# Patient Record
Sex: Female | Born: 1995
Health system: Southern US, Community
[De-identification: ages and names within clinical notes are randomized; demographics above are authoritative.]

## PROBLEM LIST (undated history)

## (undated) DIAGNOSIS — Z789 Other specified health status: Secondary | ICD-10-CM

## (undated) DIAGNOSIS — O24419 Gestational diabetes mellitus in pregnancy, unspecified control: Secondary | ICD-10-CM

## (undated) DIAGNOSIS — E119 Type 2 diabetes mellitus without complications: Secondary | ICD-10-CM

## (undated) HISTORY — PX: NO PAST SURGERIES: SHX2092

## (undated) HISTORY — PX: APPENDECTOMY: SHX54

## (undated) HISTORY — DX: Gestational diabetes mellitus in pregnancy, unspecified control: O24.419

## (undated) HISTORY — DX: Type 2 diabetes mellitus without complications: E11.9

## (undated) HISTORY — DX: Other specified health status: Z78.9

---

## 2018-12-06 ENCOUNTER — Encounter (HOSPITAL_COMMUNITY): Payer: Self-pay

## 2018-12-06 ENCOUNTER — Other Ambulatory Visit: Payer: Self-pay

## 2018-12-06 ENCOUNTER — Inpatient Hospital Stay (HOSPITAL_COMMUNITY)
Admission: AD | Admit: 2018-12-06 | Discharge: 2018-12-06 | Disposition: A | Discharge status: Home / Self Care | Payer: Medicaid Other | Attending: Obstetrics and Gynecology | Admitting: Obstetrics and Gynecology

## 2018-12-06 ENCOUNTER — Inpatient Hospital Stay (HOSPITAL_COMMUNITY): Payer: Medicaid Other

## 2018-12-06 DIAGNOSIS — N939 Abnormal uterine and vaginal bleeding, unspecified: Secondary | ICD-10-CM

## 2018-12-06 DIAGNOSIS — O209 Hemorrhage in early pregnancy, unspecified: Secondary | ICD-10-CM | POA: Insufficient documentation

## 2018-12-06 DIAGNOSIS — Z3A08 8 weeks gestation of pregnancy: Secondary | ICD-10-CM | POA: Insufficient documentation

## 2018-12-06 DIAGNOSIS — Z3A10 10 weeks gestation of pregnancy: Secondary | ICD-10-CM

## 2018-12-06 LAB — URINALYSIS, ROUTINE W REFLEX MICROSCOPIC
Bilirubin Urine: NEGATIVE
Glucose, UA: 50 mg/dL — AB
Hgb urine dipstick: NEGATIVE
Ketones, ur: NEGATIVE mg/dL
Leukocytes,Ua: NEGATIVE
Nitrite: NEGATIVE
Protein, ur: NEGATIVE mg/dL
Specific Gravity, Urine: 1.006 (ref 1.005–1.030)
pH: 7 (ref 5.0–8.0)

## 2018-12-06 LAB — COMPREHENSIVE METABOLIC PANEL
ALT: 14 U/L (ref 0–44)
AST: 17 U/L (ref 15–41)
Albumin: 3.9 g/dL (ref 3.5–5.0)
Alkaline Phosphatase: 43 U/L (ref 38–126)
Anion gap: 9 (ref 5–15)
BUN: <5 mg/dL — ABNORMAL LOW (ref 6–20)
CO2: 22 mmol/L (ref 22–32)
Calcium: 9.1 mg/dL (ref 8.9–10.3)
Chloride: 103 mmol/L (ref 98–111)
Creatinine, Ser: 0.41 mg/dL — ABNORMAL LOW (ref 0.44–1.00)
GFR calc Af Amer: >60 mL/min (ref >60)
GFR calc non Af Amer: >60 mL/min (ref >60)
Glucose, Bld: 89 mg/dL (ref 70–99)
Potassium: 3.3 mmol/L — ABNORMAL LOW (ref 3.5–5.1)
Sodium: 134 mmol/L — ABNORMAL LOW (ref 135–145)
Total Bilirubin: 0.2 mg/dL — ABNORMAL LOW (ref 0.3–1.2)
Total Protein: 7.4 g/dL (ref 6.5–8.1)

## 2018-12-06 LAB — CBC
HCT: 33.7 % — ABNORMAL LOW (ref 36.0–46.0)
Hemoglobin: 11.1 g/dL — ABNORMAL LOW (ref 12.0–15.0)
MCH: 25.6 pg — ABNORMAL LOW (ref 26.0–34.0)
MCHC: 32.9 g/dL (ref 30.0–36.0)
MCV: 77.8 fL — ABNORMAL LOW (ref 80.0–100.0)
Platelets: 192 10*3/uL (ref 150–400)
RBC: 4.33 MIL/uL (ref 3.87–5.11)
RDW: 16.2 % — ABNORMAL HIGH (ref 11.5–15.5)
WBC: 10 10*3/uL (ref 4.0–10.5)
nRBC: 0 % (ref 0.0–0.2)

## 2018-12-06 LAB — WET PREP, GENITAL
Sperm: NONE SEEN
Trich, Wet Prep: NONE SEEN
Yeast Wet Prep HPF POC: NONE SEEN

## 2018-12-06 LAB — ABO/RH: ABO/RH(D): O POS

## 2018-12-06 LAB — HCG, QUANTITATIVE, PREGNANCY: hCG, Beta Chain, Quant, S: 200996 m[IU]/mL — ABNORMAL HIGH (ref <5)

## 2018-12-06 LAB — POCT PREGNANCY, URINE: Preg Test, Ur: POSITIVE — AB

## 2018-12-06 NOTE — Discharge Instructions (Signed)
First Trimester of Pregnancy  The first trimester of pregnancy is from week 1 until the end of week 13 (months 1 through 3). During this time, your baby will begin to develop inside you. At 6-8 weeks, the eyes and face are formed, and the heartbeat can be seen on ultrasound. At the end of 12 weeks, all the baby's organs are formed. Prenatal care is all the medical care you receive before the birth of your baby. Make sure you get good prenatal care and follow all of your doctor's instructions. Follow these instructions at home: Medicines  Take over-the-counter and prescription medicines only as told by your doctor. Some medicines are safe and some medicines are not safe during pregnancy.  Take a prenatal vitamin that contains at least 600 micrograms (mcg) of folic acid.  If you have trouble pooping (constipation), take medicine that will make your stool soft (stool softener) if your doctor approves. Eating and drinking   Eat regular, healthy meals.  Your doctor will tell you the amount of weight gain that is right for you.  Avoid raw meat and uncooked cheese.  If you feel sick to your stomach (nauseous) or throw up (vomit): ? Eat 4 or 5 small meals a day instead of 3 large meals. ? Try eating a few soda crackers. ? Drink liquids between meals instead of during meals.  To prevent constipation: ? Eat foods that are high in fiber, like fresh fruits and vegetables, whole grains, and beans. ? Drink enough fluids to keep your pee (urine) clear or pale yellow. Activity  Exercise only as told by your doctor. Stop exercising if you have cramps or pain in your lower belly (abdomen) or low back.  Do not exercise if it is too hot, too humid, or if you are in a place of great height (high altitude).  Try to avoid standing for long periods of time. Move your legs often if you must stand in one place for a long time.  Avoid heavy lifting.  Wear low-heeled shoes. Sit and stand up straight.   You can have sex unless your doctor tells you not to. Relieving pain and discomfort  Wear a good support bra if your breasts are sore.  Take warm water baths (sitz baths) to soothe pain or discomfort caused by hemorrhoids. Use hemorrhoid cream if your doctor says it is okay.  Rest with your legs raised if you have leg cramps or low back pain.  If you have puffy, bulging veins (varicose veins) in your legs: ? Wear support hose or compression stockings as told by your doctor. ? Raise (elevate) your feet for 15 minutes, 3-4 times a day. ? Limit salt in your food. Prenatal care  Schedule your prenatal visits by the twelfth week of pregnancy.  Write down your questions. Take them to your prenatal visits.  Keep all your prenatal visits as told by your doctor. This is important. Safety  Wear your seat belt at all times when driving.  Make a list of emergency phone numbers. The list should include numbers for family, friends, the hospital, and police and fire departments. General instructions  Ask your doctor for a referral to a local prenatal class. Begin classes no later than at the start of month 6 of your pregnancy.  Ask for help if you need counseling or if you need help with nutrition. Your doctor can give you advice or tell you where to go for help.  Do not use hot tubs, steam   rooms, or saunas.  Do not douche or use tampons or scented sanitary pads.  Do not cross your legs for long periods of time.  Avoid all herbs and alcohol. Avoid drugs that are not approved by your doctor.  Do not use any tobacco products, including cigarettes, chewing tobacco, and electronic cigarettes. If you need help quitting, ask your doctor. You may get counseling or other support to help you quit.  Avoid cat litter boxes and soil used by cats. These carry germs that can cause birth defects in the baby and can cause a loss of your baby (miscarriage) or stillbirth.  Visit your dentist. At home,  brush your teeth with a soft toothbrush. Be gentle when you floss. Contact a doctor if:  You are dizzy.  You have mild cramps or pressure in your lower belly.  You have a nagging pain in your belly area.  You continue to feel sick to your stomach, you throw up, or you have watery poop (diarrhea).  You have a bad smelling fluid coming from your vagina.  You have pain when you pee (urinate).  You have increased puffiness (swelling) in your face, hands, legs, or ankles. Get help right away if:  You have a fever.  You are leaking fluid from your vagina.  You have spotting or bleeding from your vagina.  You have very bad belly cramping or pain.  You gain or lose weight rapidly.  You throw up blood. It may look like coffee grounds.  You are around people who have German measles, fifth disease, or chickenpox.  You have a very bad headache.  You have shortness of breath.  You have any kind of trauma, such as from a fall or a car accident. Summary  The first trimester of pregnancy is from week 1 until the end of week 13 (months 1 through 3).  To take care of yourself and your unborn baby, you will need to eat healthy meals, take medicines only if your doctor tells you to do so, and do activities that are safe for you and your baby.  Keep all follow-up visits as told by your doctor. This is important as your doctor will have to ensure that your baby is healthy and growing well. This information is not intended to replace advice given to you by your health care provider. Make sure you discuss any questions you have with your health care provider. Document Released: 11/28/2007 Document Revised: 06/19/2016 Document Reviewed: 06/19/2016 Elsevier Interactive Patient Education  2019 [employer]  

## 2018-12-06 NOTE — MAU Note (Signed)
Pt states she had some vaginal bleeding yesterday that was bright red she saw it when she wiped but did not need a pad.  She is no longer bleeding since then and is not having any pain.  Her last period was 09/27/2018.  [facility] and has an appointment later this month.

## 2018-12-06 NOTE — MAU Provider Note (Signed)
Patient Regina Hayes is a 23 y.o. G1P0 At [id number] by certain LMP here with complaints of vaginal bleeding. She denies abdominal pain, abnormal discharge, dysuria, NV, HA, body chills, fever.   SHe had a positive pregnancy test at [facility] on 5-27; she is dated by her certain LMP.  History     CSN: [id number]  Arrival date and time: 12/06/18 1542   None     Chief Complaint  Patient presents with  . Vaginal Bleeding   Vaginal Bleeding The patient's primary symptoms include vaginal bleeding. The patient's pertinent negatives include no genital itching. This is a new problem. The current episode started yesterday. The problem occurs constantly. The problem has been resolved. The patient is experiencing no pain. Pertinent negatives include no constipation, diarrhea, discolored urine, dysuria, fever, flank pain, nausea, painful intercourse, urgency or vomiting. The vaginal discharge was bloody. The vaginal bleeding is spotting. She has not been passing clots. She has not been passing tissue. Nothing aggravates the symptoms. She has tried nothing for the symptoms. She is not sexually active.   The patient states that she had two episodes of small spotting yesterday; she did not have to wear a pad. She did not have any pain; she denies any other abnormal discharge or pelvic pain.  OB History    Gravida  1   Para      Term      Preterm      AB      Living        SAB      TAB      Ectopic      Multiple      Live Births              History reviewed. No pertinent past medical history.  History reviewed. No pertinent surgical history.  History reviewed. No pertinent family history.  Social History   Tobacco Use  . Smoking status: Never Smoker  . Smokeless tobacco: Never Used  Substance Use Topics  . Alcohol use: Not Currently    Frequency: Never  . Drug use: Never    Allergies: No Known Allergies  No medications prior to admission.    Review of Systems   Constitutional: Negative for fever.  Respiratory: Negative.   Cardiovascular: Negative.   Gastrointestinal: Negative for constipation, diarrhea, nausea and vomiting.  Genitourinary: Positive for vaginal bleeding. Negative for dysuria, flank pain and urgency.  Musculoskeletal: Negative.   Neurological: Negative.   Psychiatric/Behavioral: Negative.    Physical Exam   Blood pressure 116/78, pulse 69, temperature 98.8 F (37.1 C), temperature source Oral, resp. rate 16, last menstrual period 09/27/2018, SpO2 100 %.  Physical Exam  Constitutional: She is oriented to person, place, and time. She appears well-developed and well-nourished.  HENT:  Head: Normocephalic.  Neck: Normal range of motion.  Respiratory: Effort normal and breath sounds normal.  GI: Soft.  Genitourinary:    Vagina normal.     Genitourinary Comments: NEFG; no blood in the vagina, Cervix is pink with no lesions, no lesions on vaginal walls. Thin amount of white milky discharge in the vagina. No CMT, suprapubic or adnexal tenderness.    Musculoskeletal: Normal range of motion.  Neurological: She is alert and oriented to person, place, and time.  Skin: Skin is warm and dry.  Psychiatric: She has a normal mood and affect.    MAU Course  Procedures  MDM -US confirms SIUP at 8 weeks 5 days; no SCH.  -ABO: O  positive -CBC stable at 33/11.  -clue cells on wet prep, however, patient is asymptomatic. Given that Flagyl is Cat C in pregnancy, will defer treatment.  Assessment and Plan   1. Vaginal bleeding in pregnancy, first trimester   2. Vagina bleeding    2. Patient stable for discharge; plan to keep [facility]B at [facility].   3. Reviewed warning signs, including bleeding and pain, and when to return to MAU. All questions answered.   Mervyn Skeeters Marcellius Montagna 12/06/2018, 7:33 PM

## 2018-12-08 LAB — GC/CHLAMYDIA PROBE AMP (~~LOC~~) NOT AT ARMC
Chlamydia: NEGATIVE
Neisseria Gonorrhea: NEGATIVE

## 2018-12-22 LAB — OB RESULTS CONSOLE ANTIBODY SCREEN: Antibody Screen: NEGATIVE

## 2018-12-22 LAB — CYSTIC FIBROSIS DIAGNOSTIC STUDY: Interpretation-CFDNA:: NEGATIVE

## 2018-12-22 LAB — OB RESULTS CONSOLE GC/CHLAMYDIA
Chlamydia: NEGATIVE
Gonorrhea: NEGATIVE

## 2018-12-22 LAB — OB RESULTS CONSOLE ABO/RH: RH Type: POSITIVE

## 2018-12-22 LAB — OB RESULTS CONSOLE RPR: RPR: NONREACTIVE

## 2018-12-22 LAB — OB RESULTS CONSOLE VARICELLA ZOSTER ANTIBODY, IGG: Varicella: IMMUNE

## 2018-12-22 LAB — CULTURE, OB URINE: Urine Culture, OB: NO GROWTH

## 2018-12-22 LAB — CYTOLOGY - PAP: Pap: NEGATIVE

## 2018-12-23 ENCOUNTER — Other Ambulatory Visit (HOSPITAL_COMMUNITY): Payer: Self-pay | Admitting: Family

## 2018-12-23 DIAGNOSIS — Z3682 Encounter for antenatal screening for nuchal translucency: Secondary | ICD-10-CM

## 2018-12-23 DIAGNOSIS — Z3A13 13 weeks gestation of pregnancy: Secondary | ICD-10-CM

## 2019-01-05 ENCOUNTER — Ambulatory Visit (HOSPITAL_COMMUNITY)
Admission: RE | Admit: 2019-01-05 | Discharge: 2019-01-05 | Disposition: A | Discharge status: Home / Self Care | Payer: Medicaid Other | Source: Ambulatory Visit | Attending: Obstetrics and Gynecology | Admitting: Obstetrics and Gynecology

## 2019-01-05 ENCOUNTER — Ambulatory Visit (HOSPITAL_COMMUNITY): Payer: Medicaid Other

## 2019-01-05 ENCOUNTER — Other Ambulatory Visit: Payer: Self-pay

## 2019-01-05 ENCOUNTER — Encounter (HOSPITAL_COMMUNITY): Payer: Self-pay | Admitting: *Deleted

## 2019-01-05 ENCOUNTER — Ambulatory Visit (HOSPITAL_COMMUNITY): Payer: Medicaid Other | Admitting: *Deleted

## 2019-01-05 VITALS — BP 113/74 | HR 76 | Temp 98.5°F | Ht 62.5 in | Wt 105.2 lb

## 2019-01-05 DIAGNOSIS — Z3A13 13 weeks gestation of pregnancy: Secondary | ICD-10-CM | POA: Insufficient documentation

## 2019-01-05 DIAGNOSIS — Z36 Encounter for antenatal screening for chromosomal anomalies: Secondary | ICD-10-CM

## 2019-01-05 DIAGNOSIS — Z3682 Encounter for antenatal screening for nuchal translucency: Secondary | ICD-10-CM

## 2019-01-05 DIAGNOSIS — Z369 Encounter for antenatal screening, unspecified: Secondary | ICD-10-CM

## 2019-01-07 LAB — FIRST TRIMESTER SCREEN W/NT
CRL: 74.6 mm
DIA MoM: 0.87
DIA Value: 232.4 pg/mL
Gest Age-Collect: 13.3 weeks
Maternal Age At EDD: 25.3 yr
Nuchal Translucency MoM: 1.16
Nuchal Translucency: 1.9 mm
Number of Fetuses: 1
PAPP-A MoM: 0.79
PAPP-A Value: 1622.9 ng/mL
Test Results:: NEGATIVE
Weight: 105 [lb_av]
hCG MoM: 0.8
hCG Value: 82 IU/mL

## 2019-01-28 ENCOUNTER — Other Ambulatory Visit: Payer: Self-pay

## 2019-01-28 DIAGNOSIS — Z20822 Contact with and (suspected) exposure to covid-19: Secondary | ICD-10-CM

## 2019-01-29 LAB — NOVEL CORONAVIRUS, NAA: SARS-CoV-2, NAA: NOT DETECTED

## 2019-04-20 ENCOUNTER — Other Ambulatory Visit: Payer: Self-pay | Admitting: *Deleted

## 2019-04-20 DIAGNOSIS — Z20822 Contact with and (suspected) exposure to covid-19: Secondary | ICD-10-CM

## 2019-04-21 LAB — NOVEL CORONAVIRUS, NAA: SARS-CoV-2, NAA: NOT DETECTED

## 2019-04-23 LAB — GLUCOSE TOLERANCE, 1 HOUR: Glucose 1 Hour: 230

## 2019-04-29 ENCOUNTER — Encounter: Payer: Self-pay | Admitting: *Deleted

## 2019-05-04 ENCOUNTER — Encounter: Payer: Medicaid Other | Admitting: Family Medicine

## 2019-05-07 ENCOUNTER — Encounter: Payer: Self-pay | Admitting: Obstetrics & Gynecology

## 2019-05-07 ENCOUNTER — Other Ambulatory Visit: Payer: Self-pay

## 2019-05-07 ENCOUNTER — Ambulatory Visit (INDEPENDENT_AMBULATORY_CARE_PROVIDER_SITE_OTHER): Payer: Medicaid Other | Admitting: Obstetrics & Gynecology

## 2019-05-07 DIAGNOSIS — O0990 Supervision of high risk pregnancy, unspecified, unspecified trimester: Secondary | ICD-10-CM | POA: Insufficient documentation

## 2019-05-07 DIAGNOSIS — O24419 Gestational diabetes mellitus in pregnancy, unspecified control: Secondary | ICD-10-CM | POA: Insufficient documentation

## 2019-05-07 DIAGNOSIS — Z789 Other specified health status: Secondary | ICD-10-CM

## 2019-05-07 NOTE — Progress Notes (Signed)
[place]

## 2019-05-07 NOTE — Progress Notes (Signed)
  Subjective:    Regina Hayes is a G79P0 [redacted]w[redacted]d being seen today for her first obstetrical visit.  Her obstetrical history is significant for gestational diabetes diagnosed with one hour GTT > 200 at [facility]. Patient does intend to breast feed. Pregnancy history fully reviewed.  Patient reports no complaints.  Vitals:   05/07/19 1530  BP: 112/72  Pulse: 75  Temp: 97.7 F (36.5 C)  Weight: 128 lb 3.2 oz (58.2 kg)    HISTORY: OB History  Gravida Para Term Preterm AB Living  1            SAB TAB Ectopic Multiple Live Births               # Outcome Date GA Lbr Len/2nd Weight Sex Delivery Anes PTL Lv  1 Current            Past Medical History:  Diagnosis Date  . Diabetes mellitus without complication ([place])   . Medical history non-contributory    Past Surgical History:  Procedure Laterality Date  . NO PAST SURGERIES     History reviewed. No pertinent family history.   Exam    Uterus:   30 Cm FH  Pelvic Exam: No performed                                                                   Abdomen: gravid non tender   Urinary:       Assessment:    Pregnancy: G1P0 Patient Active Problem List   Diagnosis Date Noted  . Supervision of high risk pregnancy, antepartum 05/07/2019        Plan:     Initial labs drawn. Prenatal vitamins. Problem list reviewed and updated. Genetic Screening discussed First Screen: results reviewed.  Ultrasound discussed; fetal survey: ordered.  Follow up in 2 weeks. 50% of 30 min visit spent on counseling and coordination of care.  Start BG testing and diet instructions asap   Emeterio Reeve 05/07/2019

## 2019-05-07 NOTE — Patient Instructions (Addendum)
AREA PEDIATRIC/FAMILY PRACTICE PHYSICIANS  [place]outheast [place] ([address]) . [facility] o Chambliss, MD; Eniola, MD; Hale, MD; Hensel, MD; McDiarmid, MD; McIntyer, MD; Zakariyya Helfman, MD; Walden, MD o [address] [place], [place] [address] o [phone] o Mon-Fri 8:30-12:30, 1:30-5:00 o Providers come to see babies at [facility] o Accepting Medicaid . [facility] o Limited providers who accept newborns: Koirala, MD; Morrow, MD; Wolters, MD o [address], [place], [place] [address] o [phone] o Mon-Fri 8:00-5:30 o Babies seen by providers at [facility] o Does NOT accept Medicaid o Please call early in hospitalization for appointment (limited availability)  . [facility] o Mulberry, MD o [address] [place], [place] [address] o [phone] o Mon, Tue, Thur, Fri 8:30-5:00, Wed 10:00-7:00 (closed 1-2pm) o Babies seen by [facility] providers o Accepting Medicaid . [facility] - Pediatrician o Rubin, MD o [address], [place], [place] [address] o [phone] o Mon-Fri 8:30-5:00, Sat 8:30-12:00 o Provider comes to see babies at [facility] o Accepting Medicaid o Must have been referred from current patients or contacted office prior to delivery . [facility] ([facility]) o Brown, MD; Chandler, MD; Ettefagh, MD; Grant, MD; Lester, MD; McCormick, MD; McQueen, MD; Prose, MD; Simha, MD; Stanley, MD; Stryffeler, NP; Tebben, NP o [address], [place], [place] [address] o [phone] o Mon, Tue, Thur, Fri 8:30-5:30, Wed 9:30-5:30, Sat 8:30-12:30 o Babies seen by [facility] providers o Accepting Medicaid o Only accepting infants of first-time parents or siblings of current patients o Hospital discharge coordinator will make follow-up appointment . Jack Amos o [address],  [place], [place]  [address] o [phone]   Fax - [phone] . [facility] o [address], [place], [place]  [address] o Phone - [phone]   Fax - [phone] . Shilpa Gosrani o [address], [place], [place]  [address] o [phone]  [place] ([address]) . [facility] o Bates, MD; [place], MD; Cooper, Cox, MD; MD; Davis, MD; Dovico, MD; Ettefaugh, MD; Little, MD; Lowe, MD; Keiffer, MD; Melvin, MD; Sumner, MD; Williams, MD o [address], [place], [place] [address] o [phone] o Mon-Fri 8:30-5:00 (extended evenings Mon-Thur as needed), Sat-Sun 10:00-1:00 o Providers come to see babies at [facility] o Accepting Medicaid for families of first-time babies and families with all children in the household age 3 and under. Must register with office prior to making appointment (M-F only). . [facility] o Henson, NP; Knapp, MD; Lalonde, MD; Tysinger, PA o [address] [place], [place] [address] o [phone] o Mon-Fri 8:00-5:00 o Babies seen by providers at [facility] o Does NOT accept Medicaid/Commercial Insurance Only . [facility] ([facility]  o Artis, MD; Barnes, MD; Bratton, MD; Coccaro, MD; Lockett Gardner, MD; Kramer, MD; Marshall, MD; Netherton, MD; Poleto, MD; Skinner, MD o [address] [place], [place] [address] o [phone] o Mon-Fri 8:30-5:30, Sat (Oct.-Mar.) 9:00-1:00 o Babies seen by providers at [facility] o Accepting Medicaid  [place] ([address]) . [facility] o Reid, MD; Warner, MD o [address], [place],  [address] o [phone] o Mon-Fri 8:30-5:00, Sat 8:30-12:00 o Providers come to see babies at [facility] o Does NOT accept Medicaid . [facility] o Becker, PA; Hagler, MD; Scifres, PA; Sun, MD; Swayne, MD o [address],  [place], [place] [address] o [phone] o Mon-Fri 8:00-5:00 o Babies seen by providers at [facility] o Does NOT accept Medicaid o Only accepting babies of parents who are patients o Please call early in hospitalization for appointment (limited availability) . [facility] o Clark, MD; Frye, MD; Kelleher, MD; Mack, NP; Miller, MD; O'Keller, MD; Patterson, NP; Pudlo, MD; Puzio, MD; Thomas, MD; Tucker, MD; Twiselton, MD o [address], [place], [place] [address] o [phone] o Mon-Fri 8:00-5:00, Sat 9:00-12:00 o Providers come to see babies at [facility] o Does NOT accept Medicaid  [place] ([address]) . [facility] o Limited providers accepting new patients: Brake, NP; Wharton, PA o [address], [place], [place] [address] o [phone] o Mon-Fri 8:00-5:00 o Babies seen by providers at [facility] o Does NOT accept Medicaid o Only accepting babies of parents who are patients o Please call early in hospitalization for appointment (limited availability) . [facility] o Gay, MD; Quinlan, MD o [address] [place], [place] [address] o [phone] (press 1 to schedule appointment) o Mon-Fri 8:00-5:00 o Providers come to see babies at [facility] o Does NOT accept Medicaid . [facility] o Mazer, MD o [address] [place], [place] [address] o [phone] o Mon-Fri 8:30-5:00 (lunch 12:30-1:00), extended hours by appointment only Wed 5:00-6:30 o Babies seen by [facility] providers o Accepting Medicaid . [facility] o Banks, MD; Jordan, MD; Koberlein, MD o [address], [place], [place] [address] o [phone] o Mon-Fri 8:00-5:00 o Babies seen by [facility] providers o Does NOT accept Medicaid . [facility] o Parker, MD; Hunter, MD; Wallace, DO o [address] [place], [place]  [address] o [phone] o Mon-Fri 8:00-5:00 o Babies seen by [facility] providers o Does NOT accept Medicaid . [facility] o Brandon, PA; Brecken, PA; Christy, NP; Dees, MD; DeClaire, MD; DeWeese, MD; Hansen, NP; Mills, NP; Parrish, NP; Smoot, NP; Summer, MD; Vapne, MD o [address] [place], [place] [address] o [phone] o Mon-Fri 8:30-5:00, Sat 10:00-1:00 o Providers come to see babies at [facility] o Does NOT accept Medicaid o Free prenatal information session Tuesdays at 4:45pm . [facility] o Bouska, MD; Gordon, PA; Jeffery, PA; Weber, PA o [address] [place] [address] o [phone] o Mon-Fri 7:30-5:30 o Babies seen by [facility] providers . [facility] o [address], [place], [place], [place]  [address] o [phone]   Fax - [phone]  [place] ([phone]) . [facility] o Reese, MD o [address] [place], [place] [address] o [phone] o Mon-Thur 8:00-6:00 o Providers come to see babies at [facility] o Accepting Medicaid . [facility] o Anderson, NP; Badger, MD; Beal, PA; Spencer, PA o [address] [place],  [address] o [phone] o Mon-Thur 7:30-7:30, Fri 7:30-4:30 o Babies seen by [facility] providers o Accepting Medicaid . [facility] o Agbuya, MD; Klett, NP; Romgoolam, MD o [address], ,  [address] o [phone] o Mon-Fri 8:30-5:00, Sat 8:30-12:00 o Providers come to see babies at [facility] o Accepting Medicaid o Must have "Meet & Greet" appointment at office prior to delivery . [facility]  ([facility] ) o McCord,   MD; Juleen China, MD; Clydene Laming, [address], [place], [place] [address] o [phone] o Mon-Wed 8:00-6:00, Thur-Fri 8:00-5:00, Sat 9:00-12:00 o Providers come to  see babies at [facility] o Does NOT accept Medicaid o Only accepting siblings of current patients . [facility] [place], [place], [place], [place]  [address] o [phone]   Fax [phone] . [facility] [place] [address], [place], [place]  [address] o [phone]   Fax - Morton Gorman [phone] & [phone]) . Therapist, music at [place], DO; [place], [address] [place], [place] [address] o [phone] o Mon-Fri 7:00-5:00 o Babies seen by [facility] providers o Does NOT accept Medicaid . [place], MD; [place], [place]; [place], [place], [place], [place] [address] o [phone] o Mon-Fri 8:00-5:00 o Babies seen by [facility] providers o Accepting Medicaid . Lamont, MD; [place], [place]; [place], NP; [place], [place], [place], [place] [address] o [phone] o Mon-Fri 8:00-5:00 o Babies seen by providers at [facility] [place] [phone]) . [facility], [place] o [address] [place], [place] [address] o [phone] o Mon-Fri 8:00-5:00 o Babies seen by [facility] providers o Does NOT accept Medicaid o Limited availability, please call early in hospitalization to schedule follow-up . [facility] Leilani Merl, PA; Maisie Fus, MD; [place], MD; [place], [place]; Jeannine Kitten, MD; [place], [place] [facility] [address], [place], [place] [address] o [phone] o Mon-Fri 8:30-5:00, Sat 9:00-12:00 o Babies seen by providers at [facility] o Accepting Medicaid o Please register online then schedule online or call office o [email or web address] . [place] [facility]  [place]) Kristian Covey, NP; Dwyane Dee, MD; Leonidas Romberg, PA o [address] [place], [place], [place] [address] o [phone] o Mon-Fri 8:00-5:00 o Babies seen by providers at [facility] o Accepting Medicaid . [place] ([facility]) Dairl Ponder, MD; Rayvon Char, NP; Melina Modena, MD o [address] [place], [place], [place] [address] o [phone] o Mon-Fri 8:00-5:30, Sat&Sun by appointment (phones open at 8:30) o Babies seen by [facility] providers o Accepting Medicaid o Must be a first-time baby or sibling of current patient . [place], [address], [place], [place]  [address] o [phone]   Fax - [phone]  [place] [phone] & [phone]) . [place], [place]; [place], [place]; Benjamine Mola, MD; [place], [place]; Harrell Lark, MD o [address] [place], [place] [address] o [phone] o Mon-Thur 8:00-7:00, Fri 8:00-5:00, Sat 8:00-12:00, Sun 9:00-12:00 o Babies seen by [facility] providers o Accepting Medicaid . [facility], MD; Ruthann Cancer, MD; [facility], MD o [address], [place], [place] [address] o [phone] o Mon-Thur 8:00-5:00 o Babies seen by providers at [facility] o Accepting Medicaid . [facility] [place], MD; Coe[place]Goins, MD; Amedeo Plenty, MD; Bobby Rumpf, MD; List, MD; Lavonia Drafts, MD; Ruthann Cancer, MD; Selinda Eon, MD; Audie Box, MD; Jim Like, MD; Christie Nottingham, MD; Hubbard Hartshorn, MD; Modena Nunnery, MD o [address] [place], [place]  [address] o [phone] o Mon-Fri 8:00-5:30, Sat (Oct.-Mar.) 9:00-1:00 o Babies seen by providers at [facility] o Accepting Medicaid o Must fill out new patient packet, available online at [email or web address] . [place] ([facility]) Barnabas Lister, NP; [place]on Kingfisher, NP; Claiborne Billings, NP; Rolla Plate, MD;  [place], [place]; Carola Rhine, MD; Tyron Russell, MD; Delia Chimes, NP o [address], [place], [place] [address] o [phone] o Mon-Thur 8:00-5:30, Fri 8:00-5:00 o Babies seen by providers at [place] [phone]) . [place], [place]; [place], MD; Dennard Schaumann, MD; [place], [place] o [address] [place], [place] [address] o [phone] o Mon-Fri 8:00-5:00 o Babies seen by providers at [place] [phone]) . [facility] [place], [place]; Olen Pel, MD; [place], [place], [place], [place] [address] o [phone] o Mon-Fri 8:00-5:00 o Babies seen by providers at [facility] o Does NOT accept Medicaid o Limited appointment availability, please call early in hospitalization  . Therapist, music at [place], [place]; [place] [place], [place] [address] o [phone] o Mon-Fri 8:00-5:00 o Babies seen by [facility] providers o Does NOT accept Medicaid . [facility] Su Grand, MD; Guy Sandifer, MD; [place], [place]; [place], [place] [address], [place], [place] [address] o [phone] o Mon-Fri 8:00-5:00 o After hours clinic [facility] [place], [place] [address]) [phone] Mon-Fri 5:00-8:00, Sat 12:00-6:00, Sun 10:00-4:00 o Babies seen by [facility] providers o Accepting Medicaid . [place] [facility] o [address] [place], [place]  [address] o [phone]   Fax - [phone]  [place] [phone]) . Therapist, music at [facility] Lopez Nussa, MD o [address] [place], [place], [place] [address] o [phone] o Mon-Fri 8:00-5:00 o Babies seen by [facility] providers o Does NOT accept Medicaid . [place] ([place] [facility] [place]) Bing Neighbors, MD o [address] [place], [place], [place]  [address] o [phone] o Mon-Thur 8:00-7:00, Fri 8:00-5:00, Sat 8:00-12:00 o Babies seen by providers at [facility] o Accepting Medicaid - but does not have vaccinations in office (must be received elsewhere) o Limited availability, please call early in hospitalization  [place] ([address]) . [facility]  o Carma Leaven, MD o [address], [place] [address] o [phone]  Fax [phone]  Gestational Diabetes Mellitus, Diagnosis Gestational diabetes (gestational diabetes mellitus) is a short-term (temporary) form of diabetes that can happen during pregnancy. It goes away after you give birth. It may be caused by one or both of these problems:  Your pancreas does not make enough of a hormone called insulin.  Your body does not respond in a normal way to insulin that it makes. Insulin lets sugars (glucose) go into cells in the body. This gives you energy. If you have diabetes, sugars cannot get into cells. This causes high blood sugar (hyperglycemia). If you get gestational diabetes, you are:  More likely to get it if you get pregnant again.  More likely to develop type 2 diabetes in the future. If gestational diabetes is treated, it may not hurt you or your baby. Your doctor will set treatment goals for you. In general, you should have these blood sugar levels:  After not eating for a long time (  fasting): 95 mg/dL (5.3 mmol/L).  After meals (postprandial): ? One hour after a meal: at or below 140 mg/dL (7.8 mmol/L). ? Two hours after a meal: at or below 120 mg/dL (6.7 mmol/L).  A1c (hemoglobin A1c) level: 6-6.5%. Follow these instructions at home: Questions to ask your doctor   You may want to ask these questions: ? Do I need to meet with a diabetes educator? ? What equipment will I need to care for myself at home? ? What medicines do I need? When should I take them? ? How often do I need to check my blood sugar? ? What number can I call if I  have questions? ? When is my next doctor's visit? General instructions  Take over-the-counter and prescription medicines only as told by your doctor.  Stay at a healthy weight during pregnancy.  Keep all follow-up visits as told by your doctor. This is important. Contact a doctor if:  Your blood sugar is at or above 240 mg/dL (96.2 mmol/L).  Your blood sugar is at or above 200 mg/dL (95.2 mmol/L) and you have ketones in your pee (urine).  You have been sick or have had a fever for 2 days or more and you are not getting better.  You have any of these problems for more than 6 hours: ? You cannot eat or drink. ? You feel sick to your stomach (nauseous). ? You throw up (vomit). ? You have watery poop (diarrhea). Get help right away if:  Your blood sugar is lower than 54 mg/dL (3 mmol/L).  You get confused.  You have trouble: ? Thinking clearly. ? Breathing.  Your baby moves less than normal.  You have any of these: ? Moderate or large ketone levels in your pee. ? Blood coming from your vagina. ? Unusual fluid coming from your vagina. ? Early contractions. These may feel like tightness in your belly. Summary  Gestational diabetes is a short-term form of diabetes. It can happen while you are pregnant. It goes away after you give birth.  If gestational diabetes is treated, it may not hurt you or your baby. Your doctor will set treatment goals for you.  Keep all follow-up visits as told by your doctor. This is important. This information is not intended to replace advice given to you by your health care provider. Make sure you discuss any questions you have with your health care provider. Document Released: 10/03/2015 Document Revised: 07/18/2017 Document Reviewed: 07/15/2015 Elsevier Patient Education  2020 [employer].

## 2019-05-08 MED ORDER — BLOOD PRESSURE MONITORING DEVI: 1.0000 | 0 refills | Status: DC

## 2019-05-08 NOTE — Addendum Note (Signed)
Addended by: Bethanne Ginger on: 05/08/2019 09:58 AM   Modules accepted: Orders

## 2019-05-28 ENCOUNTER — Ambulatory Visit (INDEPENDENT_AMBULATORY_CARE_PROVIDER_SITE_OTHER): Payer: Medicaid Other | Admitting: Obstetrics and Gynecology

## 2019-05-28 ENCOUNTER — Other Ambulatory Visit: Payer: Self-pay

## 2019-05-28 VITALS — BP 116/66 | HR 76 | Wt 133.7 lb

## 2019-05-28 DIAGNOSIS — O24419 Gestational diabetes mellitus in pregnancy, unspecified control: Secondary | ICD-10-CM

## 2019-05-28 DIAGNOSIS — Z3A33 33 weeks gestation of pregnancy: Secondary | ICD-10-CM

## 2019-05-28 DIAGNOSIS — O0990 Supervision of high risk pregnancy, unspecified, unspecified trimester: Secondary | ICD-10-CM

## 2019-05-28 DIAGNOSIS — O0993 Supervision of high risk pregnancy, unspecified, third trimester: Secondary | ICD-10-CM

## 2019-05-28 MED ORDER — ACCU-CHEK GUIDE W/DEVICE KIT: 1.0000 [IU] | PACK | Freq: Three times a day (TID) | 0 refills | Status: DC

## 2019-05-28 MED ORDER — GLUCOSE BLOOD VI STRP: ORAL_STRIP | 12 refills | Status: DC

## 2019-05-28 MED ORDER — ACCU-CHEK FASTCLIX LANCETS MISC: 1.0000 | Freq: Four times a day (QID) | 12 refills | Status: DC

## 2019-05-28 NOTE — Patient Instructions (Signed)
Gestational Diabetes Mellitus, Diagnosis Gestational diabetes (gestational diabetes mellitus) is a short-term (temporary) form of diabetes that can happen during pregnancy. It goes away after you give birth. It may be caused by one or both of these problems:  Your pancreas does not make enough of a hormone called insulin.  Your body does not respond in a normal way to insulin that it makes. Insulin lets sugars (glucose) go into cells in the body. This gives you energy. If you have diabetes, sugars cannot get into cells. This causes high blood sugar (hyperglycemia). If you get gestational diabetes, you are:  More likely to get it if you get pregnant again.  More likely to develop type 2 diabetes in the future. If gestational diabetes is treated, it may not hurt you or your baby. Your doctor will set treatment goals for you. In general, you should have these blood sugar levels:  After not eating for a long time (fasting): 95 mg/dL (5.3 mmol/L).  After meals (postprandial): ? One hour after a meal: at or below 140 mg/dL (7.8 mmol/L). ? Two hours after a meal: at or below 120 mg/dL (6.7 mmol/L).  A1c (hemoglobin A1c) level: 6-6.5%. Follow these instructions at home: Questions to ask your doctor   You may want to ask these questions: ? Do I need to meet with a diabetes educator? ? What equipment will I need to care for myself at home? ? What medicines do I need? When should I take them? ? How often do I need to check my blood sugar? ? What number can I call if I have questions? ? When is my next doctor's visit? General instructions  Take over-the-counter and prescription medicines only as told by your doctor.  Stay at a healthy weight during pregnancy.  Keep all follow-up visits as told by your doctor. This is important. Contact a doctor if:  Your blood sugar is at or above 240 mg/dL ([phone] mmol/L).  Your blood sugar is at or above 200 mg/dL ([phone] mmol/L) and you have ketones in  your pee (urine).  You have been sick or have had a fever for 2 days or more and you are not getting better.  You have any of these problems for more than 6 hours: ? You cannot eat or drink. ? You feel sick to your stomach (nauseous). ? You throw up (vomit). ? You have watery poop (diarrhea). Get help right away if:  Your blood sugar is lower than 54 mg/dL (3 mmol/L).  You get confused.  You have trouble: ? Thinking clearly. ? Breathing.  Your baby moves less than normal.  You have any of these: ? Moderate or large ketone levels in your pee. ? Blood coming from your vagina. ? Unusual fluid coming from your vagina. ? Early contractions. These may feel like tightness in your belly. Summary  Gestational diabetes is a short-term form of diabetes. It can happen while you are pregnant. It goes away after you give birth.  If gestational diabetes is treated, it may not hurt you or your baby. Your doctor will set treatment goals for you.  Keep all follow-up visits as told by your doctor. This is important. This information is not intended to replace advice given to you by your health care provider. Make sure you discuss any questions you have with your health care provider. Document Released: 10/03/2015 Document Revised: 07/18/2017 Document Reviewed: 07/15/2015 Elsevier Patient Education  2020 [employer] Third Trimester of Pregnancy  The third trimester  is from week 28 through week 40 (months 7 through 9). This trimester is when your unborn baby (fetus) is growing very fast. At the end of the ninth month, the unborn baby is about 20 inches in length. It weighs about 6-10 pounds. Follow these instructions at home: Medicines  Take over-the-counter and prescription medicines only as told by your doctor. Some medicines are safe and some medicines are not safe during pregnancy.  Take a prenatal vitamin that contains at least 600 micrograms (mcg) of folic acid.  If you have trouble  pooping (constipation), take medicine that will make your stool soft (stool softener) if your doctor approves. Eating and drinking   Eat regular, healthy meals.  Avoid raw meat and uncooked cheese.  If you get low calcium from the food you eat, talk to your doctor about taking a daily calcium supplement.  Eat four or five small meals rather than three large meals a day.  Avoid foods that are high in fat and sugars, such as fried and sweet foods.  To prevent constipation: ? Eat foods that are high in fiber, like fresh fruits and vegetables, whole grains, and beans. ? Drink enough fluids to keep your pee (urine) clear or pale yellow. Activity  Exercise only as told by your doctor. Stop exercising if you start to have cramps.  Avoid heavy lifting, wear low heels, and sit up straight.  Do not exercise if it is too hot, too humid, or if you are in a place of great height (high altitude).  You may continue to have sex unless your doctor tells you not to. Relieving pain and discomfort  Wear a good support bra if your breasts are tender.  Take frequent breaks and rest with your legs raised if you have leg cramps or low back pain.  Take warm water baths (sitz baths) to soothe pain or discomfort caused by hemorrhoids. Use hemorrhoid cream if your doctor approves.  If you develop puffy, bulging veins (varicose veins) in your legs: ? Wear support hose or compression stockings as told by your doctor. ? Raise (elevate) your feet for 15 minutes, 3-4 times a day. ? Limit salt in your food. Safety  Wear your seat belt when driving.  Make a list of emergency phone numbers, including numbers for family, friends, the hospital, and police and fire departments. Preparing for your baby's arrival To prepare for the arrival of your baby:  Take prenatal classes.  Practice driving to the hospital.  Visit the hospital and tour the maternity area.  Talk to your work about taking leave once the  baby comes.  Pack your hospital bag.  Prepare the baby's room.  Go to your doctor visits.  Buy a rear-facing car seat. Learn how to install it in your car. General instructions  Do not use hot tubs, steam rooms, or saunas.  Do not use any products that contain nicotine or tobacco, such as cigarettes and e-cigarettes. If you need help quitting, ask your doctor.  Do not drink alcohol.  Do not douche or use tampons or scented sanitary pads.  Do not cross your legs for long periods of time.  Do not travel for long distances unless you must. Only do so if your doctor says it is okay.  Visit your dentist if you have not gone during your pregnancy. Use a soft toothbrush to brush your teeth. Be gentle when you floss.  Avoid cat litter boxes and soil used by cats. These carry germs that  can cause birth defects in the baby and can cause a loss of your baby (miscarriage) or stillbirth.  Keep all your prenatal visits as told by your doctor. This is important. Contact a doctor if:  You are not sure if you are in labor or if your water has broken.  You are dizzy.  You have mild cramps or pressure in your lower belly.  You have a nagging pain in your belly area.  You continue to feel sick to your stomach, you throw up, or you have watery poop.  You have bad smelling fluid coming from your vagina.  You have pain when you pee. Get help right away if:  You have a fever.  You are leaking fluid from your vagina.  You are spotting or bleeding from your vagina.  You have severe belly cramps or pain.  You lose or gain weight quickly.  You have trouble catching your breath and have chest pain.  You notice sudden or extreme puffiness (swelling) of your face, hands, ankles, feet, or legs.  You have not felt the baby move in over an hour.  You have severe headaches that do not go away with medicine.  You have trouble seeing.  You are leaking, or you are having a gush of fluid,  from your vagina before you are 37 weeks.  You have regular belly spasms (contractions) before you are 37 weeks. Summary  The third trimester is from week 28 through week 40 (months 7 through 9). This time is when your unborn baby is growing very fast.  Follow your doctor's advice about medicine, food, and activity.  Get ready for the arrival of your baby by taking prenatal classes, getting all the baby items ready, preparing the baby's room, and visiting your doctor to be checked.  Get help right away if you are bleeding from your vagina, or you have chest pain and trouble catching your breath, or if you have not felt your baby move in over an hour. This information is not intended to replace advice given to you by your health care provider. Make sure you discuss any questions you have with your health care provider. Document Released: 09/05/2009 Document Revised: 10/02/2018 Document Reviewed: 07/17/2016 Elsevier Patient Education  2020 [employer].

## 2019-05-28 NOTE — Progress Notes (Signed)
Pt stated has not rcvd BP Cuff, so I called [facility] and confirmed with them that it was ready for pick up. Gave pt address to pick it up.

## 2019-05-28 NOTE — Progress Notes (Signed)
Subjective:  Regina Hayes is a 23 y.o. G1P0 at 14w3dbeing seen today for ongoing prenatal care.  She is currently monitored for the following issues for this high-risk pregnancy and has Supervision of high risk pregnancy, antepartum; Gestational diabetes mellitus (GDM), antepartum; and Language barrier affecting health care on their problem list.  Patient reports no complaints.  Contractions: Not present. Vag. Bleeding: None.  Movement: Present. Denies leaking of fluid.   The following portions of the patient's history were reviewed and updated as appropriate: allergies, current medications, past family history, past medical history, past social history, past surgical history and problem list. Problem list updated.  Objective:   Vitals:   05/28/19 1549  BP: 116/66  Pulse: 76  Weight: 133 lb 11.2 oz (60.6 kg)    Fetal Status: Fetal Heart Rate (bpm): 155   Movement: Present     General:  Alert, oriented and cooperative. Patient is in no acute distress.  Skin: Skin is warm and dry. No rash noted.   Cardiovascular: Normal heart rate noted  Respiratory: Normal respiratory effort, no problems with respiration noted  Abdomen: Soft, gravid, appropriate for gestational age. Pain/Pressure: Present     Pelvic:  Cervical exam deferred        Extremities: Normal range of motion.  Edema: None  Mental Status: Normal mood and affect. Normal behavior. Normal judgment and thought content.   Urinalysis:      Assessment and Plan:  Pregnancy: G1P0 at 393w3d1. Supervision of high risk pregnancy, antepartum Stable - Ambulatory referral to Nutrition and Diabetic Education - Blood Glucose Monitoring Suppl (ACCU-CHEK GUIDE) w/Device KIT; 1 Units by Does not apply route 3 (three) times daily.  Dispense: 1 kit; Refill: 0 - Accu-Chek FastClix Lancets MISC; 1 each by Percutaneous route 4 (four) times daily.  Dispense: 100 each; Refill: 12 - glucose blood test strip; Use as instructed  Dispense: 50 each;  Refill: 12  2. Gestational diabetes mellitus (GDM), antepartum, gestational diabetes method of control unspecified Pt has not obtain Glucometer or had DM education. All reordered and reviewed with pt - Ambulatory referral to Nutrition and Diabetic Education - Blood Glucose Monitoring Suppl (ACCU-CHEK GUIDE) w/Device KIT; 1 Units by Does not apply route 3 (three) times daily.  Dispense: 1 kit; Refill: 0 - Accu-Chek FastClix Lancets MISC; 1 each by Percutaneous route 4 (four) times daily.  Dispense: 100 each; Refill: 12 - glucose blood test strip; Use as instructed  Dispense: 50 each; Refill: 12  Live interrupter used during today's visit  Preterm labor symptoms and general obstetric precautions including but not limited to vaginal bleeding, contractions, leaking of fluid and fetal movement were reviewed in detail with the patient. Please refer to After Visit Summary for other counseling recommendations.  Return in about 2 weeks (around 06/11/2019) for Appt with Diabetic educator ASAP, OB visit, face to face with MD.   ErChancy MilroyMD

## 2019-06-08 ENCOUNTER — Ambulatory Visit (HOSPITAL_COMMUNITY): Payer: Medicaid Other | Admitting: *Deleted

## 2019-06-08 ENCOUNTER — Encounter (HOSPITAL_COMMUNITY): Payer: Self-pay | Admitting: *Deleted

## 2019-06-08 ENCOUNTER — Other Ambulatory Visit: Payer: Self-pay | Admitting: Obstetrics and Gynecology

## 2019-06-08 ENCOUNTER — Ambulatory Visit (HOSPITAL_COMMUNITY)
Admission: RE | Admit: 2019-06-08 | Discharge: 2019-06-08 | Disposition: A | Discharge status: Home / Self Care | Payer: Medicaid Other | Source: Ambulatory Visit | Attending: Obstetrics and Gynecology | Admitting: Obstetrics and Gynecology

## 2019-06-08 ENCOUNTER — Other Ambulatory Visit: Payer: Self-pay

## 2019-06-08 DIAGNOSIS — O24419 Gestational diabetes mellitus in pregnancy, unspecified control: Secondary | ICD-10-CM

## 2019-06-08 DIAGNOSIS — O0990 Supervision of high risk pregnancy, unspecified, unspecified trimester: Secondary | ICD-10-CM

## 2019-06-08 DIAGNOSIS — O24410 Gestational diabetes mellitus in pregnancy, diet controlled: Secondary | ICD-10-CM

## 2019-06-08 DIAGNOSIS — Z3A35 35 weeks gestation of pregnancy: Secondary | ICD-10-CM | POA: Diagnosis not present

## 2019-06-11 ENCOUNTER — Ambulatory Visit (INDEPENDENT_AMBULATORY_CARE_PROVIDER_SITE_OTHER): Payer: Medicaid Other | Admitting: Obstetrics and Gynecology

## 2019-06-11 ENCOUNTER — Encounter: Payer: Medicaid Other | Attending: Obstetrics and Gynecology | Admitting: *Deleted

## 2019-06-11 ENCOUNTER — Ambulatory Visit: Payer: Medicaid Other | Admitting: *Deleted

## 2019-06-11 ENCOUNTER — Other Ambulatory Visit: Payer: Self-pay

## 2019-06-11 ENCOUNTER — Encounter: Payer: Self-pay | Admitting: Obstetrics and Gynecology

## 2019-06-11 VITALS — BP 103/64 | HR 78 | Wt 133.8 lb

## 2019-06-11 DIAGNOSIS — O0990 Supervision of high risk pregnancy, unspecified, unspecified trimester: Secondary | ICD-10-CM

## 2019-06-11 DIAGNOSIS — O24419 Gestational diabetes mellitus in pregnancy, unspecified control: Secondary | ICD-10-CM | POA: Diagnosis not present

## 2019-06-11 DIAGNOSIS — Z23 Encounter for immunization: Secondary | ICD-10-CM | POA: Diagnosis not present

## 2019-06-11 DIAGNOSIS — O0993 Supervision of high risk pregnancy, unspecified, third trimester: Secondary | ICD-10-CM | POA: Insufficient documentation

## 2019-06-11 DIAGNOSIS — Z3A35 35 weeks gestation of pregnancy: Secondary | ICD-10-CM

## 2019-06-11 DIAGNOSIS — O24414 Gestational diabetes mellitus in pregnancy, insulin controlled: Secondary | ICD-10-CM

## 2019-06-11 DIAGNOSIS — Z713 Dietary counseling and surveillance: Secondary | ICD-10-CM | POA: Insufficient documentation

## 2019-06-11 DIAGNOSIS — Z789 Other specified health status: Secondary | ICD-10-CM | POA: Diagnosis not present

## 2019-06-11 MED ORDER — INSULIN NPH (HUMAN) (ISOPHANE) 100 UNIT/ML ~~LOC~~ SUSP: 10.0000 [IU] | Freq: Two times a day (BID) | SUBCUTANEOUS | 11 refills | Status: DC

## 2019-06-11 MED ORDER — INSULIN LISPRO 100 UNIT/ML ~~LOC~~ SOLN: 5.0000 [IU] | Freq: Three times a day (TID) | SUBCUTANEOUS | 2 refills | Status: DC

## 2019-06-11 MED ORDER — "INSULIN SYRINGE-NEEDLE U-100 30G X 15/64"" 0.5 ML MISC": 1.0000 | Freq: Four times a day (QID) | 5 refills | Status: DC | PRN

## 2019-06-11 MED ORDER — ACCU-CHEK SOFTCLIX LANCETS MISC: 2 refills | Status: DC

## 2019-06-11 NOTE — Progress Notes (Signed)
Insulin Instruction  Patient is [redacted]w[redacted]d and her EDD is 07/13/2019. Dr. K. Davis would like her instructed on insulin today with the following orders.  Patient speaks Nepali, we had live interpeertor here for this visit.  Patient was seen on 06/11/2019 for insulin instruction.  MD orders are: NPH @ 10 units AM and 10 units PM    Humalog @ 5 units each meal The following learning objectives were met by the patient during this visit:   Insulin Action of NPH and Humalog insulins  Reviewed syringe & vial including # units per syringe and vial  Hygiene and storage  Drawing up single and mixed doses if using vials   Single dose   Mixed dose (I used color coding to differentiate NPH (cloudy) and Humalog (clear) insulins  Rotation of Sites  Hypoglycemia- symptoms, causes, treatment choices  Record keeping and MD follow up  Patient demonstrated understanding of insulin administration by return demonstration for both singe and mixed doses  Patient showed me her lancing device for her Accu Chek meter and she received the Soft Clix instead of the Fast Clix. So the Fast Clix drums that were prescribed will not fit. I had an extra Fast Clix in my supplies which I gave her to use going forward. She expressed understanding of how to use the drums correctly.   Patient received the following handouts:  Insulin Instruction Handout                                        Patient will be seen for follow-up as needed.  

## 2019-06-11 NOTE — Addendum Note (Signed)
Addended by: Louisa Second E on: 06/11/2019 11:43 AM   Modules accepted: Orders

## 2019-06-11 NOTE — Progress Notes (Signed)
   PRENATAL VISIT NOTE  Subjective:  Regina Hayes is a 23 y.o. G1P0 at [redacted]w[redacted]d being seen today for ongoing prenatal care.  She is currently monitored for the following issues for this high-risk pregnancy and has Supervision of high risk pregnancy, antepartum; Gestational diabetes mellitus (GDM), antepartum; and Language barrier affecting health care on their problem list.  Patient reports no complaints.  Contractions: Irritability. Vag. Bleeding: None.  Movement: Present. Denies leaking of fluid.   The following portions of the patient's history were reviewed and updated as appropriate: allergies, current medications, past family history, past medical history, past social history, past surgical history and problem list.   Objective:   Vitals:   06/11/19 0846  BP: 103/64  Pulse: 78  Weight: 133 lb 12.8 oz (60.7 kg)    Fetal Status: Fetal Heart Rate (bpm): 136   Movement: Present     General:  Alert, oriented and cooperative. Patient is in no acute distress.  Skin: Skin is warm and dry. No rash noted.   Cardiovascular: Normal heart rate noted  Respiratory: Normal respiratory effort, no problems with respiration noted  Abdomen: Soft, gravid, appropriate for gestational age.  Pain/Pressure: Present     Pelvic: Cervical exam deferred        Extremities: Normal range of motion.  Edema: None  Mental Status: Normal mood and affect. Normal behavior. Normal judgment and thought content.   Assessment and Plan:  Pregnancy: G1P0 at [redacted]w[redacted]d  1. Supervision of high risk pregnacy, antepartum  2. Language barrier affecting health care Nepali  3. Diet controlled gestational diabetes mellitus (GDM) during pregnancy, antepartum - Last growth @ 90th%ile - Seeing diabetes and nutrition today - Did not bring glucometer today - States she ran out of lancets/strips, reviewed that refills are at pharmacy - States all FG are > 100, after breakfast all > 100, after lunch all > 200 Will need to start  insulin today, consult with Bev NPH 10/10, Humalog 5 TID  Preterm labor symptoms and general obstetric precautions including but not limited to vaginal bleeding, contractions, leaking of fluid and fetal movement were reviewed in detail with the patient. Please refer to After Visit Summary for other counseling recommendations.   Return in about 2 weeks (around 06/25/2019) for high OB, in person.  No future appointments.  Sloan Leiter, MD

## 2019-06-25 ENCOUNTER — Ambulatory Visit: Payer: Self-pay

## 2019-06-25 ENCOUNTER — Ambulatory Visit (INDEPENDENT_AMBULATORY_CARE_PROVIDER_SITE_OTHER): Payer: Medicaid Other | Admitting: Family Medicine

## 2019-06-25 ENCOUNTER — Other Ambulatory Visit: Payer: Self-pay

## 2019-06-25 ENCOUNTER — Ambulatory Visit (INDEPENDENT_AMBULATORY_CARE_PROVIDER_SITE_OTHER): Payer: Medicaid Other | Admitting: *Deleted

## 2019-06-25 ENCOUNTER — Other Ambulatory Visit (HOSPITAL_COMMUNITY)
Admission: RE | Admit: 2019-06-25 | Discharge: 2019-06-25 | Disposition: A | Discharge status: Home / Self Care | Payer: Medicaid Other | Source: Ambulatory Visit | Attending: Family Medicine | Admitting: Family Medicine

## 2019-06-25 VITALS — BP 108/67 | HR 73 | Wt 136.1 lb

## 2019-06-25 DIAGNOSIS — O24414 Gestational diabetes mellitus in pregnancy, insulin controlled: Secondary | ICD-10-CM

## 2019-06-25 DIAGNOSIS — O0990 Supervision of high risk pregnancy, unspecified, unspecified trimester: Secondary | ICD-10-CM | POA: Insufficient documentation

## 2019-06-25 DIAGNOSIS — Z3A37 37 weeks gestation of pregnancy: Secondary | ICD-10-CM

## 2019-06-25 DIAGNOSIS — Z758 Other problems related to medical facilities and other health care: Secondary | ICD-10-CM

## 2019-06-25 DIAGNOSIS — Z789 Other specified health status: Secondary | ICD-10-CM

## 2019-06-25 DIAGNOSIS — O0993 Supervision of high risk pregnancy, unspecified, third trimester: Secondary | ICD-10-CM

## 2019-06-25 MED ORDER — INSULIN LISPRO 100 UNIT/ML ~~LOC~~ SOLN: 10.0000 [IU] | Freq: Three times a day (TID) | SUBCUTANEOUS | 2 refills | Status: DC

## 2019-06-25 MED ORDER — INSULIN NPH (HUMAN) (ISOPHANE) 100 UNIT/ML ~~LOC~~ SUSP: 10.0000 [IU] | Freq: Two times a day (BID) | SUBCUTANEOUS | 11 refills | Status: DC

## 2019-06-25 NOTE — Progress Notes (Signed)
Subjective:  Regina Hayes is a 23 y.o. G1P0 at [redacted]w[redacted]d being seen today for ongoing prenatal care.  She is currently monitored for the following issues for this high-risk pregnancy and has Supervision of high risk pregnancy, antepartum; Gestational diabetes mellitus (GDM), antepartum; and Language barrier affecting health care on their problem list.  GDM: Patient taking NPH 10 units BID, humalog 5 units with meals.  Reports no hypoglycemic episodes.  Tolerating medication well Fasting: 75-97, mostly controlled 2hr PP: 144-244  Patient reports no complaints.  Contractions: Irritability. Vag. Bleeding: None.  Movement: Present. Denies leaking of fluid.   The following portions of the patient's history were reviewed and updated as appropriate: allergies, current medications, past family history, past medical history, past social history, past surgical history and problem list. Problem list updated.  Objective:   Vitals:   06/25/19 0937  BP: 108/67  Pulse: 73  Weight: 136 lb 1.6 oz (61.7 kg)    Fetal Status: Fetal Heart Rate (bpm): NST   Movement: Present     General:  Alert, oriented and cooperative. Patient is in no acute distress.  Skin: Skin is warm and dry. No rash noted.   Cardiovascular: Normal heart rate noted  Respiratory: Normal respiratory effort, no problems with respiration noted  Abdomen: Soft, gravid, appropriate for gestational age. Pain/Pressure: Present     Pelvic: Vag. Bleeding: None     Cervical exam deferred        Extremities: Normal range of motion.  Edema: None  Mental Status: Normal mood and affect. Normal behavior. Normal judgment and thought content.   Urinalysis:      Assessment and Plan:  Pregnancy: G1P0 at [redacted]w[redacted]d  1. Supervision of high risk pregnancy, antepartum FHT and FH normal - GC/Chlamydia probe amp ([place])not at [facility] - Strep Gp B NAA  2. Insulin controlled gestational diabetes mellitus (GDM) during pregnancy, antepartum Increase NPH to  15 units at breakfast, maintain 10 units at night Increase humalog to 10 units before meals. BPP 10/10  3. Language barrier affecting health care Interpreter used  Term labor symptoms and general obstetric precautions including but not limited to vaginal bleeding, contractions, leaking of fluid and fetal movement were reviewed in detail with the patient. Please refer to After Visit Summary for other counseling recommendations.  Return in about 1 week (around 07/02/2019) for [facility] and NST/BPP.   Truett Mainland, DO

## 2019-06-25 NOTE — Progress Notes (Signed)
IOL scheduled 1/11 in AM

## 2019-06-25 NOTE — Addendum Note (Signed)
Addended by: Truett Mainland on: 06/25/2019 11:08 AM   Modules accepted: Orders

## 2019-06-25 NOTE — Progress Notes (Signed)
Interpreter Casimer Leek present for encounter.

## 2019-06-26 NOTE — L&D Delivery Note (Addendum)
OB/GYN Faculty Practice Delivery Note  Regina Hayes is a 24 y.o. G1P1001 s/p induced vaginal at [redacted]w[redacted]d for A1GDM.   GBS Status: --[name] (12/31 1135) Maximum Maternal Temperature: Temp (24hrs), Avg:98.2 F (36.8 C), Min:97.8 F (36.6 C), Max:98.7 F (37.1 C)  Labor Progress: Admitted for IOL S/p FB, cytotec x 1, pitocin AROM 4h 73m prior to delivery with scant fluid  Pitocin until complete dilation achieved.   Delivery Date/Time: 07/08/2019 at 0410 Delivery: Patient pushed for about two hours prior to delivery. Prolonged decel for 3-4 minutes. Obtained consent for vacuum delivery via interpretor. Dr. Debroah Loop notified of impending vacuum. Kiwi place by [name]. Head delivered ROA at next push.  No nuchal cord present. . Dr. Selena Batten took over delivery. Shoulder and body delivered in usual fashion. Infant with spontaneous cry, placed skin to skin on mother's abdomen, dried and stimulated. Cord clamped x 2 after 1-minute delay, and cut by Clinical research associate. Cord blood drawn. Placenta delivered spontaneously with gentle cord traction. Fundus firm with massage and Pitocin. Labia, perineum, vagina, and cervix inspected with  3C by Dr. Debroah Loop .   Placenta: spontaneous , intact  Complications: 3C perineal lac EBL: 766 mL  Analgesia: none  Postpartum Planning Mom with no marked hypotension or tachycardia. Mom and baby to mother/baby.  Lactation consult AM CBC Contraception undecided    Social work: no   Infant: Viable female  APGAR: 8/9  3779g  [name], M.D.  07/08/2019 12:13 AM   The above was performed under my direct supervision and guidance.

## 2019-06-26 NOTE — L&D Delivery Note (Signed)
Delivery Note Pt pushed for about 2 hours.  When vtx crowned up, FHR dropped to 50-70 for 3-4 minutes w/o further progress towards delivery. Verbal consent: obtained from patient using interpreter. Dr .Debroah Loop notified of impending vacuum.  Kiwi applied and fetal head delivered w/next push at 4:10 am.  At this point, delivery turned over to Dr. Selena Batten.  Fetal body delivered using traction on anterior axilla.   (Presentation:ROA      ).  APGAR: 8/9 , ; weight pending  After 1 minute, the cord was clamped and cut. 40 units of pitocin diluted in 1000cc LR was infused rapidly IV.  The placenta separated spontaneously and delivered via CCT and maternal pushing effort.  It was inspected and appears to be intact with a 3 VC. Vagina inspected and 3C lac found  Repair in usual fashion by Dr. Debroah Loop  Anesthesia: None Episiotomy: None Lacerations:  3C Suture Repair:  Est. Blood Loss (mL):    Mom to postpartum.  Baby to Couplet care / Skin to Skin.  Regina Hayes 07/07/2019, 4:26 AM

## 2019-06-27 LAB — STREP GP B NAA: Strep Gp B NAA: NEGATIVE

## 2019-06-29 ENCOUNTER — Telehealth: Payer: Self-pay | Admitting: *Deleted

## 2019-06-29 ENCOUNTER — Other Ambulatory Visit: Payer: Self-pay | Admitting: Family Medicine

## 2019-06-29 LAB — GC/CHLAMYDIA PROBE AMP (~~LOC~~) NOT AT ARMC
Chlamydia: NEGATIVE
Comment: NEGATIVE
Comment: NORMAL
Neisseria Gonorrhea: NEGATIVE

## 2019-06-29 NOTE — Progress Notes (Signed)
Admission orders placed  Marlowe Alt, DO OB Fellow, Faculty Practice 06/29/2019 11:13 PM

## 2019-06-29 NOTE — Telephone Encounter (Signed)
Roswell Miners, a pharmacist at [facility] left a voice message 06/25/19 at 12:53 stating she is calling re: clarifying Humulin N orders for Regina Hayes. States order states 10 units before meals but then states 14 units am and 10 units in pm.    Per chart review Dr.Stinson noted will increase to NPH 15 units in am and continue 10 units in pm.  Order did have both in order . I called [facility] and claified order should read NPH 15 units in am and 10 units in pm . Vernadine Coombs,RN

## 2019-06-30 ENCOUNTER — Ambulatory Visit (INDEPENDENT_AMBULATORY_CARE_PROVIDER_SITE_OTHER): Payer: Medicaid Other | Admitting: Family Medicine

## 2019-06-30 ENCOUNTER — Other Ambulatory Visit: Payer: Self-pay

## 2019-06-30 ENCOUNTER — Telehealth (HOSPITAL_COMMUNITY): Payer: Self-pay | Admitting: *Deleted

## 2019-06-30 VITALS — BP 117/75 | HR 71 | Temp 98.5°F | Wt 142.2 lb

## 2019-06-30 DIAGNOSIS — O0990 Supervision of high risk pregnancy, unspecified, unspecified trimester: Secondary | ICD-10-CM

## 2019-06-30 DIAGNOSIS — Z3A38 38 weeks gestation of pregnancy: Secondary | ICD-10-CM

## 2019-06-30 DIAGNOSIS — O24414 Gestational diabetes mellitus in pregnancy, insulin controlled: Secondary | ICD-10-CM

## 2019-06-30 DIAGNOSIS — O0993 Supervision of high risk pregnancy, unspecified, third trimester: Secondary | ICD-10-CM

## 2019-06-30 DIAGNOSIS — Z789 Other specified health status: Secondary | ICD-10-CM

## 2019-06-30 NOTE — Progress Notes (Signed)
   Subjective:  Regina Hayes is a 24 y.o. G1P0 at [redacted]w[redacted]d being seen today for ongoing prenatal care.  She is currently monitored for the following issues for this high-risk pregnancy and has Supervision of high risk pregnancy, antepartum; Gestational diabetes mellitus (GDM), antepartum; and Language barrier affecting health care on their problem list.  Patient reports no complaints.  Contractions: Not present. Vag. Bleeding: None.  Movement: Present. Denies leaking of fluid.   The following portions of the patient's history were reviewed and updated as appropriate: allergies, current medications, past family history, past medical history, past social history, past surgical history and problem list. Problem list updated.  Objective:   Vitals:   06/30/19 1145  BP: 117/75  Pulse: 71  Temp: 98.5 F (36.9 C)  Weight: 142 lb 3.2 oz (64.5 kg)    Fetal Status: Fetal Heart Rate (bpm): 156 Fundal Height: 39 cm Movement: Present     General:  Alert, oriented and cooperative. Patient is in no acute distress.  Skin: Skin is warm and dry. No rash noted.   Cardiovascular: Normal heart rate noted  Respiratory: Normal respiratory effort, no problems with respiration noted  Abdomen: Soft, gravid, appropriate for gestational age. Pain/Pressure: Absent     Pelvic: Vag. Bleeding: None     Cervical exam deferred        Extremities: Normal range of motion.  Edema: None  Mental Status: Normal mood and affect. Normal behavior. Normal judgment and thought content.   Urinalysis:      Assessment and Plan:  Pregnancy: G1P0 at [redacted]w[redacted]d  1. Supervision of high risk pregnancy, antepartum Stable  IOL scheduled for 07/06/2019 [facility] records not complete, no record for Rubella or Hep B, message sent to clerics to request cover sheet/labs from HD  2. Insulin controlled gestational diabetes mellitus (GDM) during pregnancy, antepartum Checking fasting and 1-1.5hr PP Fasting sugars all within goal since last  visit 6/12 PP elevated, ranging 90-179 Does not know names of insulins, refers to NPH as "cloudy" insulin and lispro as "clear insulin, but is taking them as prescribed Given good fastings and sugars somewhat difficult to interpret with timing of checks will increase lispro 10>12 Last growth 06/08/2019 EFW 3022 g and 90% BPP 10/10 on 06/25/2019 BPP scheduled for 07/01/2018 IOL @ 39wks scheduled for 07/06/2019  3. Language barrier affecting health care Nepali Interpreter [phone] used throughout visit  Term labor symptoms and general obstetric precautions including but not limited to vaginal bleeding, contractions, leaking of fluid and fetal movement were reviewed in detail with the patient. Please refer to After Visit Summary for other counseling recommendations.  Return in 1 week (on 07/07/2019) for [facility], in person, needs MD.   Venora Maples, MD

## 2019-06-30 NOTE — Progress Notes (Signed)
[employer] video interpreter [place] ID # [id number] used for encounter.

## 2019-06-30 NOTE — Telephone Encounter (Signed)
Preadmission screen Interpreter number [phone]

## 2019-06-30 NOTE — Patient Instructions (Signed)

## 2019-07-02 ENCOUNTER — Encounter (HOSPITAL_COMMUNITY): Payer: Self-pay

## 2019-07-02 ENCOUNTER — Ambulatory Visit (HOSPITAL_COMMUNITY)
Admission: RE | Admit: 2019-07-02 | Discharge: 2019-07-02 | Disposition: A | Discharge status: Home / Self Care | Payer: Medicaid Other | Source: Ambulatory Visit | Attending: Obstetrics and Gynecology | Admitting: Obstetrics and Gynecology

## 2019-07-02 ENCOUNTER — Ambulatory Visit (HOSPITAL_COMMUNITY): Payer: Medicaid Other | Admitting: *Deleted

## 2019-07-02 ENCOUNTER — Other Ambulatory Visit: Payer: Self-pay

## 2019-07-02 DIAGNOSIS — O0990 Supervision of high risk pregnancy, unspecified, unspecified trimester: Secondary | ICD-10-CM | POA: Diagnosis present

## 2019-07-02 DIAGNOSIS — Z362 Encounter for other antenatal screening follow-up: Secondary | ICD-10-CM | POA: Diagnosis not present

## 2019-07-02 DIAGNOSIS — O24414 Gestational diabetes mellitus in pregnancy, insulin controlled: Secondary | ICD-10-CM | POA: Diagnosis present

## 2019-07-02 DIAGNOSIS — Z3A38 38 weeks gestation of pregnancy: Secondary | ICD-10-CM | POA: Diagnosis not present

## 2019-07-04 ENCOUNTER — Other Ambulatory Visit (HOSPITAL_COMMUNITY)
Admission: RE | Admit: 2019-07-04 | Discharge: 2019-07-04 | Disposition: A | Discharge status: Home / Self Care | Payer: Medicaid Other | Source: Ambulatory Visit | Attending: Family Medicine | Admitting: Family Medicine

## 2019-07-04 DIAGNOSIS — Z01812 Encounter for preprocedural laboratory examination: Secondary | ICD-10-CM | POA: Insufficient documentation

## 2019-07-04 DIAGNOSIS — Z20822 Contact with and (suspected) exposure to covid-19: Secondary | ICD-10-CM | POA: Insufficient documentation

## 2019-07-04 LAB — SARS CORONAVIRUS 2 (TAT 6-24 HRS): SARS Coronavirus 2: NEGATIVE

## 2019-07-06 ENCOUNTER — Inpatient Hospital Stay (HOSPITAL_COMMUNITY): Payer: Medicaid Other

## 2019-07-06 ENCOUNTER — Other Ambulatory Visit: Payer: Self-pay

## 2019-07-06 ENCOUNTER — Encounter (HOSPITAL_COMMUNITY): Payer: Self-pay | Admitting: Obstetrics & Gynecology

## 2019-07-06 ENCOUNTER — Inpatient Hospital Stay (HOSPITAL_COMMUNITY)
Admission: AD | Admit: 2019-07-06 | Discharge: 2019-07-09 | DRG: 768 | Disposition: A | Discharge status: Home / Self Care | Payer: Medicaid Other | Attending: Obstetrics & Gynecology | Admitting: Obstetrics & Gynecology

## 2019-07-06 DIAGNOSIS — Z975 Presence of (intrauterine) contraceptive device: Secondary | ICD-10-CM

## 2019-07-06 DIAGNOSIS — O24414 Gestational diabetes mellitus in pregnancy, insulin controlled: Secondary | ICD-10-CM

## 2019-07-06 DIAGNOSIS — D62 Acute posthemorrhagic anemia: Secondary | ICD-10-CM | POA: Diagnosis not present

## 2019-07-06 DIAGNOSIS — O9081 Anemia of the puerperium: Secondary | ICD-10-CM | POA: Diagnosis not present

## 2019-07-06 DIAGNOSIS — O24424 Gestational diabetes mellitus in childbirth, insulin controlled: Principal | ICD-10-CM | POA: Diagnosis present

## 2019-07-06 DIAGNOSIS — Z3A39 39 weeks gestation of pregnancy: Secondary | ICD-10-CM

## 2019-07-06 DIAGNOSIS — Z3490 Encounter for supervision of normal pregnancy, unspecified, unspecified trimester: Secondary | ICD-10-CM | POA: Diagnosis present

## 2019-07-06 DIAGNOSIS — Z30017 Encounter for initial prescription of implantable subdermal contraceptive: Secondary | ICD-10-CM

## 2019-07-06 DIAGNOSIS — Z3046 Encounter for surveillance of implantable subdermal contraceptive: Secondary | ICD-10-CM | POA: Diagnosis not present

## 2019-07-06 DIAGNOSIS — Z789 Other specified health status: Secondary | ICD-10-CM

## 2019-07-06 LAB — CBC
HCT: 41.1 % (ref 36.0–46.0)
Hemoglobin: 13.6 g/dL (ref 12.0–15.0)
MCH: 29 pg (ref 26.0–34.0)
MCHC: 33.1 g/dL (ref 30.0–36.0)
MCV: 87.6 fL (ref 80.0–100.0)
Platelets: 161 10*3/uL (ref 150–400)
RBC: 4.69 MIL/uL (ref 3.87–5.11)
RDW: 12.9 % (ref 11.5–15.5)
WBC: 9.8 10*3/uL (ref 4.0–10.5)
nRBC: 0 % (ref 0.0–0.2)

## 2019-07-06 LAB — RPR: RPR Ser Ql: NONREACTIVE

## 2019-07-06 LAB — GLUCOSE, CAPILLARY
Glucose-Capillary: 161 mg/dL — ABNORMAL HIGH (ref 70–99)
Glucose-Capillary: 52 mg/dL — ABNORMAL LOW (ref 70–99)
Glucose-Capillary: 79 mg/dL (ref 70–99)
Glucose-Capillary: 60 mg/dL — ABNORMAL LOW (ref 70–99)
Glucose-Capillary: 80 mg/dL (ref 70–99)
Glucose-Capillary: 72 mg/dL (ref 70–99)

## 2019-07-06 LAB — TYPE AND SCREEN
ABO/RH(D): O POS
Antibody Screen: NEGATIVE

## 2019-07-06 MED ORDER — TERBUTALINE SULFATE 1 MG/ML IJ SOLN: 0.2500 mg | Freq: Once | INTRAMUSCULAR | Status: DC | PRN

## 2019-07-06 MED ORDER — LIDOCAINE HCL (PF) 1 % IJ SOLN
30.0000 mL | INTRAMUSCULAR | Status: AC | PRN
  Administered 2019-07-07: 04:00:00 30 mL via SUBCUTANEOUS
  Filled 2019-07-06: qty 30

## 2019-07-06 MED ORDER — OXYTOCIN 40 UNITS IN NORMAL SALINE INFUSION - SIMPLE MED
1.0000 m[IU]/min | INTRAVENOUS | Status: DC
  Administered 2019-07-06: 2 m[IU]/min via INTRAVENOUS

## 2019-07-06 MED ORDER — LACTATED RINGERS IV SOLN: INTRAVENOUS | Status: DC

## 2019-07-06 MED ORDER — SOD CITRATE-CITRIC ACID 500-334 MG/5ML PO SOLN: 30.0000 mL | ORAL | Status: DC | PRN

## 2019-07-06 MED ORDER — FENTANYL CITRATE (PF) 100 MCG/2ML IJ SOLN
100.0000 ug | INTRAMUSCULAR | Status: DC | PRN
  Administered 2019-07-07 (×2): 100 ug via INTRAVENOUS
  Filled 2019-07-06 (×2): qty 2

## 2019-07-06 MED ORDER — ONDANSETRON HCL 4 MG/2ML IJ SOLN: 4.0000 mg | Freq: Four times a day (QID) | INTRAMUSCULAR | Status: DC | PRN

## 2019-07-06 MED ORDER — OXYTOCIN BOLUS FROM INFUSION
500.0000 mL | Freq: Once | INTRAVENOUS | Status: AC
  Administered 2019-07-07: 04:00:00 500 mL via INTRAVENOUS

## 2019-07-06 MED ORDER — MISOPROSTOL 50MCG HALF TABLET
50.0000 ug | ORAL_TABLET | ORAL | Status: DC
  Administered 2019-07-06: 11:00:00 50 ug via BUCCAL

## 2019-07-06 MED ORDER — OXYTOCIN 40 UNITS IN NORMAL SALINE INFUSION - SIMPLE MED
2.5000 [IU]/h | INTRAVENOUS | Status: DC
  Filled 2019-07-06: qty 1000

## 2019-07-06 MED ORDER — LACTATED RINGERS IV SOLN: 500.0000 mL | INTRAVENOUS | Status: DC | PRN

## 2019-07-06 MED ORDER — MISOPROSTOL 50MCG HALF TABLET
ORAL_TABLET | ORAL | Status: AC
  Filled 2019-07-06: qty 1

## 2019-07-06 MED ORDER — ACETAMINOPHEN 325 MG PO TABS: 650.0000 mg | ORAL_TABLET | ORAL | Status: DC | PRN

## 2019-07-06 NOTE — Progress Notes (Signed)
Regina Hayes is a 24 y.o. G1P0 at [redacted]w[redacted]d by LMP admitted for induction of labor due to A2GDM.  Subjective: Doing well. Not feeling much pain or pressure.   Objective: BP 115/76   Pulse 67   Temp 98.6 F (37 C) (Oral)   Resp 18   Ht 5\' 3"  (1.6 m)   Wt 64.4 kg   LMP 09/27/2018 (Exact Date)   BMI 25.15 kg/m   FHT:  FHR: 120 bpm, variability: moderate,  accelerations:  Present,  decelerations:  Absent UC:   regular, every 1.5-2 minutes SVE:   Dilation: 4 Effacement (%): 80 Station: -1 Exam by:: [email or web address] RN  Labs: Lab Results  Component Value Date   WBC 9.8 07/06/2019   HGB 13.6 07/06/2019   HCT 41.1 07/06/2019   MCV 87.6 07/06/2019   PLT 161 07/06/2019   Recent Labs  Lab 07/06/19 0814 07/06/19 1159 07/06/19 1237 07/06/19 1639 07/06/19 1809  GLUCAP 161* 52* 79 60* 80   Assessment / Plan: Induction of labor due to A2GDM  Labor: s/p FB and cytotec x 1. Pit at 1550 and currently 87mu/min Fetal Wellbeing:  Category I Pain Control:  IV pain meds and per patient request I/D:  n/a Anticipated MOD:  VD GDM: Sugars as above. Continue q4 hour checks  Language Barrier: 4m used in room via tablet  Publishing copy 07/06/2019, 9:26 PM

## 2019-07-06 NOTE — Progress Notes (Signed)
Regina Hayes is a 24 y.o. G1P0 at [redacted]w[redacted]d by LMP admitted for induction of labor due to A2GDM.  Subjective: Denies headaches, flashers, floaters, persistent RUQ pain.   Objective: BP 128/84   Pulse (!) 59   Temp 98.5 F (36.9 C) (Oral)   Resp 16   Ht 5\' 3"  (1.6 m)   Wt 64.4 kg   LMP 09/27/2018 (Exact Date)   BMI 25.15 kg/m   FHT:  FHR: 125 bpm, variability: moderate,  accelerations:  Present,  decelerations:  Absent UC:   regular, every 1.5-4 minutes SVE:   Dilation: 5 Effacement (%): 90 Station: -1 Exam by:: Dr. [email or web address]  Labs: Lab Results  Component Value Date   WBC 9.8 07/06/2019   HGB 13.6 07/06/2019   HCT 41.1 07/06/2019   MCV 87.6 07/06/2019   PLT 161 07/06/2019   Recent Labs  Lab 07/06/19 1159 07/06/19 1237 07/06/19 1639 07/06/19 1809 07/06/19 2201  GLUCAP 52* 79 60* 80 72   Assessment / Plan: Induction of labor due to A2GDM  Labor: s/p FB and cytotec x 1, pit 2x2 at 1550, currently 58mu/min. AROM'ed with scant fluid. Continue pitocin.  Fetal Wellbeing:  Category I Pain Control:  IV pain meds and per patient request. No anticipated epidural. I/D:  n/a Anticipated MOD:  VD GDM: Sugars as above. Continue q4 hour checks  Language Barrier: 18m used in room via tablet  Publishing copy 07/07/2019, 12:25 AM

## 2019-07-06 NOTE — Progress Notes (Signed)
Hypoglycemic Event  CBG: 52  Treatment: 8 oz juice/soda  Symptoms: None  Follow-up CBG: Time:1237 CBG Result:79  Possible Reasons for Event: Inadequate meal intake  Comments/MD notified:Rolitta Arita Miss made aware     Regina Hayes C

## 2019-07-06 NOTE — Progress Notes (Signed)
Interpreter # [phone] used. Patient had question about ordering meal.

## 2019-07-06 NOTE — Progress Notes (Addendum)
Nila Winker is a 24 y.o. G1P0 at [redacted]w[redacted]d admitted for induction of labor due to Gestational diabetes type A2  Subjective: Patient reports +FMs. No LOF, VB, blurry vision, headaches, peripheral edema, or RUQ pain. She endorses mild contractions.   Objective: BP 118/72   Pulse 60   Temp 98.1 F (36.7 C) (Oral)   Resp 18   Ht 5\' 3"  (1.6 m)   Wt 64.4 kg   LMP 09/27/2018 (Exact Date)   BMI 25.15 kg/m  No intake/output data recorded.  FHT:  FHR: 120 bpm, variability: moderate,  accelerations:  Present,  decelerations:  Absent UC: regular, every 2-3 minutes  Labs: Lab Results  Component Value Date   WBC 9.8 07/06/2019   HGB 13.6 07/06/2019   HCT 41.1 07/06/2019   MCV 87.6 07/06/2019   PLT 161 07/06/2019    Assessment / Plan: Almendra Loria is a 24 yo G1P0 presenting for IOL at [redacted]w[redacted]d.   Labor: Foley bulb in place. S/p cytotec x1. Consider additional Cytotec at ~1500 if foley bulb has not come out and further cervical ripening required. AROM and Pitocin as appropriate. Fetal Wellbeing:  Category I Pain Control:  Labor support without medications, epidural per patient request Anticipated MOD:  vaginal, CS as appropriate Postpartum Contraception:  Nexplanon   [redacted]w[redacted]d, MS3   07/06/2019, 1:12 PM  GME ATTESTATION:  I saw and evaluated the patient. I agree with the findings and the plan of care as documented in the student's note.  09/03/2019, DO OB Fellow, Faculty [facility], Center for [facility] 07/06/2019 1:29 PM

## 2019-07-06 NOTE — Progress Notes (Signed)
Interpreter # [phone] used for patient admission, discuss plan of care, questions answered, etc.

## 2019-07-06 NOTE — Progress Notes (Addendum)
Regina Hayes is a 24 y.o. G1P0 at [redacted]w[redacted]d admitted for induction of labor due to Gestational diabetes type A2.  Subjective: Patient reports her foley bulb fell out. She endorses contractions that still feel like menstrual cramps.   Objective: BP 130/73   Pulse (!) 59   Temp 98.1 F (36.7 C) (Oral)   Resp 20   Ht 5\' 3"  (1.6 m)   Wt 64.4 kg   LMP 09/27/2018 (Exact Date)   BMI 25.15 kg/m  No intake/output data recorded.  FHT:  FHR: 135  bpm, variability: moderate,  accelerations:  Present,  decelerations:  Absent UC:   regular, every 2-3 minutes  SVE:   Dilation: 4 Effacement (%): 80 Station: -2 Exam by:: [email or web address] RN  Pitocin @  2 mu/min   Labs: Lab Results  Component Value Date   WBC 9.8 07/06/2019   HGB 13.6 07/06/2019   HCT 41.1 07/06/2019   MCV 87.6 07/06/2019   PLT 161 07/06/2019    Assessment / Plan: Regina Hayes is a 24 yo G1P0 presenting for IOL at [redacted]w[redacted]d for A2GDM.   Labor: S/p FB and cyto x1. Bishop score 9, will initiate pitocin as her contractions are still feeling like mild period cramps. Consider AROM as appropriate.  Fetal Wellbeing:  Category I Pain Control:   IV pain meds per patient request  Anticipated MOD: Vaginal  Postpartum Contraception: Nexplanon   [redacted]w[redacted]d, MS3   07/06/2019, 3:49 PM  GME ATTESTATION:  I saw and evaluated the patient. I agree with the findings and the plan of care as documented in the student's note.  09/03/2019, DO OB Fellow, Faculty [facility], Center for [facility] 07/06/2019 4:10 PM

## 2019-07-06 NOTE — H&P (Addendum)
OBSTETRIC ADMISSION HISTORY AND PHYSICAL  Regina Hayes is a 24 y.o. female G1P0 with IUP at 66w0dby LMP presenting for IOL for A2GDM.She reports +FMs. No LOF, VB, blurry vision, headaches, peripheral edema, or RUQ pain. She plans on bottlefeeding. She is currently undecided regarding birth control but was counselled on the different types. Patient's A2GDM is insulin controlled with adequate fasting glucoses (73-97) but wide range of postprandials (90-240s) measurements. May be due to uncertainty regarding timing of checks due to communication challenges with language barrier.   [facility]  Support person in labor: Husband   Ultrasounds 12/06/18 8+5, CRL 21.773m7/13/20 13+2, NT 06/08/19 35+0, EFW 3022 g and 90% 07/02/19 38+3, EFW 3269 45th%ile  Prenatal History/Complications: A2N4OEVanguage Barrier, Nepali  OB BOX:  Nursing Staff Provider  [place]  [facility]  Estimated Date of Delivery: 07/13/19  Language  Nepali Anatomy [place]  Flu Vaccine  06/11/19 Genetic Screen  First Screen: 01/05/2019 Negative    TDaP vaccine    Hgb A1C or  GTT Early  Third trimester   Rhogam  NA   LAB RESULTS   Feeding Plan Bottle Blood Type O/Positive/-- (06/29 0000)   Contraception Undecided Antibody Negative (06/29 0000)  Circumcision Girl Rubella   Immune 12/22/18  Pediatrician  List given RPR Nonreactive (06/29 0000)   Support Person Chhavi(FOB) HBsAg  NonReactive (12/22/2018)  Prenatal Classes  HIV  NR 04/22/2019  BTL Consent n/a GBS --/NHenderson Cloud12/31 1135)negative  VBAC Consent  Pap NILM 11/2018    Hgb Electro  Normal (12/22/2018)  BP Cuff Ordered 05/08/2019 CF Negative (12/22/2018)     SMA     Waterbirth  [ ]  Class [ ]  Consent [ ]  CNM visit    Past Medical History: Past Medical History:  Diagnosis Date   Diabetes mellitus without complication ([facility]   Gestational diabetes    Medical history non-contributory     Past Surgical History: Past  Surgical History:  Procedure Laterality Date   NO PAST SURGERIES      Obstetrical History: OB History     Gravida  1   Para      Term      Preterm      AB      Living         SAB      TAB      Ectopic      Multiple      Live Births              Social History: Social History   Socioeconomic History   Marital status: Married    Spouse name: Not on file   Number of children: Not on file   Years of education: Not on file   Highest education level: Not on file  Occupational History   Not on file  Tobacco Use   Smoking status: Never Smoker   Smokeless tobacco: Never Used  Substance and Sexual Activity   Alcohol use: Not Currently   Drug use: Never   Sexual activity: Yes    Birth control/protection: None  Other Topics Concern   Not on file  Social History Narrative   Not on file   Social Determinants of Health   Financial Resource Strain:    Difficulty of Paying Living Expenses: Not on file  Food Insecurity: No Food Insecurity   Worried About RuReubensn the Last Year: Never true   Ran  Out of Food in the Last Year: Never true  Transportation Needs: No Transportation Needs   Lack of Transportation (Medical): No   Lack of Transportation (Non-Medical): No  Physical Activity:    Days of Exercise per Week: Not on file   Minutes of Exercise per Session: Not on file  Stress:    Feeling of Stress : Not on file  Social Connections:    Frequency of Communication with Friends and Family: Not on file   Frequency of Social Gatherings with Friends and Family: Not on file   Attends Religious Services: Not on file   Active Member of Clubs or Organizations: Not on file   Attends Archivist Meetings: Not on file   Marital Status: Not on file    Family History: History reviewed. No pertinent family history.  Allergies: No Known Allergies  Medications Prior to Admission  Medication Sig Dispense Refill Last Dose   insulin lispro  (HUMALOG) 100 UNIT/ML injection Inject 0.1 mLs (10 Units total) into the skin 3 (three) times daily before meals. 10 mL 2 07/06/2019 at Unknown time   insulin NPH Human (HUMULIN N) 100 UNIT/ML injection Inject 0.1 mLs (10 Units total) into the skin 2 (two) times daily before a meal. 15 units in AM, 10 units at night 10 mL 11 07/06/2019 at Unknown time   Prenatal Vit-Fe Fumarate-FA (PRENATAL VITAMINS PO) Take by mouth.   07/06/2019 at Unknown time   Accu-Chek Softclix Lancets lancets Use as instructed 100 each 2    Blood Glucose Monitoring Suppl (ACCU-CHEK GUIDE) w/Device KIT 1 Units by Does not apply route 3 (three) times daily. 1 kit 0    Blood Pressure Monitoring DEVI 1 Device by Does not apply route once a week. 1 Device 0    glucose blood test strip Use as instructed 50 each 12    Insulin Syringe-Needle U-100 30G X 15/64" 0.5 ML MISC 1 Syringe by Does not apply route 4 (four) times daily as needed. 100 each 5      Review of Systems  All systems reviewed and negative except as stated in HPI  Blood pressure 115/81, pulse 81, temperature 98.1 F (36.7 C), temperature source Oral, resp. rate 16, height 5' 3"  (1.6 m), weight 64.4 kg, last menstrual period 09/27/2018. General appearance: alert, cooperative and no distress Lungs: no respiratory distress Heart: regular rate  Abdomen: soft, non-tender; gravid b Extremities: Homans sign is negative, no sign of DVT Presentation: cephalic Fetal monitoring: Baseline: 140 bpm, moderate variability, accels, no decels  Uterine activity: irritability  Dilation: 1 Effacement (%): 70 Station: -2 Exam by:: Laury Deep CNM   Prenatal labs: ABO, Rh: --/--/O POS (01/11 0830) Antibody: NEG (01/11 0830) Rubella:   Immune 12/22/18 RPR: Nonreactive (06/29 0000)  HBsAg:   Non reactive (06/29) HIV:   NR 04/22/2019 GBS: --Henderson Cloud (12/31 1135)  Glucola: 04/23/19 - 230    Prenatal Transfer Tool  Maternal Diabetes: Yes:  Diabetes Type:   Insulin/Medication controlled Genetic Screening: Normal Maternal Ultrasounds/Referrals: Normal Fetal Ultrasounds or other Referrals:  Referred to Materal Fetal Medicine  Maternal Substance Abuse:  No Significant Maternal Medications:  Meds include: Other: Insulin  Significant Maternal Lab Results: Other: Varicella Non-immune   Results for orders placed or performed during the hospital encounter of 07/06/19 (from the past 24 hour(s))  CBC   Collection Time: 07/06/19  7:40 AM  Result Value Ref Range   WBC 9.8 4.0 - 10.5 K/uL   RBC 4.69 3.87 - 5.11  MIL/uL   Hemoglobin 13.6 12.0 - 15.0 g/dL   HCT 41.1 36.0 - 46.0 %   MCV 87.6 80.0 - 100.0 fL   MCH 29.0 26.0 - 34.0 pg   MCHC 33.1 30.0 - 36.0 g/dL   RDW 12.9 11.5 - 15.5 %   Platelets 161 150 - 400 K/uL   nRBC 0.0 0.0 - 0.2 %  Glucose, capillary   Collection Time: 07/06/19  8:14 AM  Result Value Ref Range   Glucose-Capillary 161 (H) 70 - 99 mg/dL  Type and screen   Collection Time: 07/06/19  8:30 AM  Result Value Ref Range   ABO/RH(D) PENDING    Antibody Screen PENDING    Sample Expiration      07/09/2019,2359 Performed at [facility], [place] [address] [place], [place] [address]     Patient Active Problem List   Diagnosis Date Noted   Encounter for induction of labor 07/06/2019   Supervision of high risk pregnancy, antepartum 05/07/2019   Gestational diabetes mellitus (GDM), antepartum 05/07/2019   Language barrier affecting health care 05/07/2019    Assessment/Plan:  Ray Glacken is a 24 y.o. G1P0 at 32w0dhere [facility]  Labor: Latent -- pain control: undecided, per patient request   Fetal Wellbeing: 07/02/19 38+3, EFW 3269 45th%ile -- GBS (negative) -- continuous fetal monitoring - reactive  Postpartum Planning -- bottle feeding  -- Undecided on birthcontrol  -- Varicella vaccine    Plan: Admit to labor and delivery   -SVE -Place foley bulb  -Cytotec 50 mcg q4hrs  -Start pitocin once  foley bulb has come out  -Continuous monitoring  -Epidural PRN  LPhill Mutter Medical Student  07/06/2019, 9:33 AM   I confirm that I have verified the information documented in the nurse midwife student's note and that I have also personally reperformed the history, physical exam and all medical decision making activities of this service and have verified that all service and findings are accurately documented in this student's note.   Cytotec was ordered to be administered buccally.  DLaury Deep CNM 07/06/2019 12:49 PM

## 2019-07-07 ENCOUNTER — Encounter (HOSPITAL_COMMUNITY): Payer: Self-pay | Admitting: Obstetrics & Gynecology

## 2019-07-07 DIAGNOSIS — Z3A39 39 weeks gestation of pregnancy: Secondary | ICD-10-CM

## 2019-07-07 DIAGNOSIS — O24424 Gestational diabetes mellitus in childbirth, insulin controlled: Secondary | ICD-10-CM

## 2019-07-07 MED ORDER — METHYLERGONOVINE MALEATE 0.2 MG PO TABS: 0.2000 mg | ORAL_TABLET | ORAL | Status: DC | PRN

## 2019-07-07 MED ORDER — MEASLES, MUMPS & RUBELLA VAC IJ SOLR: 0.5000 mL | Freq: Once | INTRAMUSCULAR | Status: DC

## 2019-07-07 MED ORDER — ONDANSETRON HCL 4 MG/2ML IJ SOLN: 4.0000 mg | INTRAMUSCULAR | Status: DC | PRN

## 2019-07-07 MED ORDER — SIMETHICONE 80 MG PO CHEW: 80.0000 mg | CHEWABLE_TABLET | ORAL | Status: DC | PRN

## 2019-07-07 MED ORDER — DIPHENHYDRAMINE HCL 25 MG PO CAPS: 25.0000 mg | ORAL_CAPSULE | Freq: Four times a day (QID) | ORAL | Status: DC | PRN

## 2019-07-07 MED ORDER — FLEET ENEMA 7-19 GM/118ML RE ENEM: 1.0000 | ENEMA | Freq: Every day | RECTAL | Status: DC | PRN

## 2019-07-07 MED ORDER — WITCH HAZEL-GLYCERIN EX PADS: 1.0000 "application " | MEDICATED_PAD | CUTANEOUS | Status: DC | PRN

## 2019-07-07 MED ORDER — IBUPROFEN 600 MG PO TABS
600.0000 mg | ORAL_TABLET | Freq: Four times a day (QID) | ORAL | Status: DC
  Administered 2019-07-08 – 2019-07-09 (×6): 600 mg via ORAL
  Filled 2019-07-07 (×6): qty 1

## 2019-07-07 MED ORDER — OXYCODONE HCL 5 MG PO TABS: 5.0000 mg | ORAL_TABLET | Freq: Three times a day (TID) | ORAL | 0 refills | Status: AC | PRN

## 2019-07-07 MED ORDER — DOCUSATE SODIUM 100 MG PO CAPS
100.0000 mg | ORAL_CAPSULE | Freq: Two times a day (BID) | ORAL | Status: DC
  Administered 2019-07-07 – 2019-07-09 (×4): 100 mg via ORAL
  Filled 2019-07-07 (×4): qty 1

## 2019-07-07 MED ORDER — PRENATAL MULTIVITAMIN CH
1.0000 | ORAL_TABLET | Freq: Every day | ORAL | Status: DC
  Administered 2019-07-08 – 2019-07-09 (×2): 1 via ORAL
  Filled 2019-07-07 (×2): qty 1

## 2019-07-07 MED ORDER — OXYCODONE HCL 5 MG PO TABS: 5.0000 mg | ORAL_TABLET | Freq: Three times a day (TID) | ORAL | 0 refills | Status: DC | PRN

## 2019-07-07 MED ORDER — ONDANSETRON HCL 4 MG PO TABS: 4.0000 mg | ORAL_TABLET | ORAL | Status: DC | PRN

## 2019-07-07 MED ORDER — TETANUS-DIPHTH-ACELL PERTUSSIS 5-2.5-18.5 LF-MCG/0.5 IM SUSP: 0.5000 mL | Freq: Once | INTRAMUSCULAR | Status: DC

## 2019-07-07 MED ORDER — BISACODYL 10 MG RE SUPP: 10.0000 mg | Freq: Every day | RECTAL | Status: DC | PRN

## 2019-07-07 MED ORDER — COCONUT OIL OIL: 1.0000 "application " | TOPICAL_OIL | Status: DC | PRN

## 2019-07-07 MED ORDER — FERROUS SULFATE 325 (65 FE) MG PO TABS
325.0000 mg | ORAL_TABLET | Freq: Two times a day (BID) | ORAL | Status: DC
  Administered 2019-07-07 – 2019-07-09 (×5): 325 mg via ORAL
  Filled 2019-07-07 (×5): qty 1

## 2019-07-07 MED ORDER — OXYCODONE HCL 5 MG PO TABS: 5.0000 mg | ORAL_TABLET | ORAL | Status: DC | PRN

## 2019-07-07 MED ORDER — ZOLPIDEM TARTRATE 5 MG PO TABS: 5.0000 mg | ORAL_TABLET | Freq: Every evening | ORAL | Status: DC | PRN

## 2019-07-07 MED ORDER — ACETAMINOPHEN 325 MG PO TABS
650.0000 mg | ORAL_TABLET | ORAL | Status: DC | PRN
  Filled 2019-07-07: qty 2

## 2019-07-07 MED ORDER — METHYLERGONOVINE MALEATE 0.2 MG/ML IJ SOLN: 0.2000 mg | INTRAMUSCULAR | Status: DC | PRN

## 2019-07-07 MED ORDER — DIBUCAINE (PERIANAL) 1 % EX OINT: 1.0000 "application " | TOPICAL_OINTMENT | CUTANEOUS | Status: DC | PRN

## 2019-07-07 MED ORDER — BENZOCAINE-MENTHOL 20-0.5 % EX AERO
1.0000 "application " | INHALATION_SPRAY | CUTANEOUS | Status: DC | PRN
  Administered 2019-07-07: 1 via TOPICAL
  Filled 2019-07-07: qty 56

## 2019-07-07 MED FILL — oxyCODONE HCL 5 MG TABS: 5 | 6 days supply | Qty: 20 | Fill #0

## 2019-07-07 NOTE — Consult Note (Signed)
  Requested by Dr. Debroah Loop to attend this vaginal delivery for vacuum-assist in Room 204.  Delivery team was excused when they arrived since infant came out crying vigorously.   Overton Mam, MD (Attending Neonatologist)

## 2019-07-07 NOTE — Discharge Summary (Addendum)
Postpartum Discharge Summary     Patient Name: Regina Hayes DOB: [birth year] MRN: [id number]  Date of admission: 07/06/2019 Delivering Provider: Zettie Cooley E   Date of discharge: 07/09/2019  Admitting diagnosis: Encounter for induction of labor [Z34.90] Intrauterine pregnancy: [redacted]w[redacted]d    Secondary diagnosis:  Active Problems:   Encounter for induction of labor   Nexplanon in place  Additional problems: A2DM, Anemia     Discharge diagnosis: GDM A1, Anemia and 3C perineal laceration                                                                                                 Post partum procedures: none  Augmentation: AROM, Pitocin, Cytotec and Foley Balloon  Complications: Hemoglobin drop from 11.9>9.7(PPD#1)> 9.5 (PPD#2). Will continue iron supplements outpatient. Patient will need CBC recheck in 1 week.   Hospital course:  Induction of Labor With Vaginal Delivery   24y.o. yo G1P0 at 324w1das admitted to the hospital 07/06/2019 for induction of labor.  Indication for induction: A2 DM.  Patient had an uncomplicated labor course as follows: terminal bradycardia so VAD, 3C lac Membrane Rupture Time/Date: 12:03 AM ,07/07/2019   Intrapartum Procedures: Episiotomy: None [1]                                         Lacerations:  3rd degree [4];Perineal [11] 3C Patient had delivery of a Viable infant.  Information for the patient's newborn:  TaDejah, Droesslerirl [id number]]Delivery Method: Vaginal, Vacuum (Extractor)(Filed from Delivery Summary)    07/07/2019  Details of delivery can be found in separate delivery note.  Patient had a routine postpartum course. Patient is discharged home 07/09/19. Delivery time: 4:10 AM    Magnesium Sulfate received: No BMZ received: No Rhophylac:N/A MMR:No Transfusion:No  Physical exam  Vitals:   07/08/19 0515 07/08/19 1500 07/08/19 2034 07/09/19 0530  BP: 108/74 (!) 110/58 111/72 113/75  Pulse: 71 61 62 72  Resp: 17 18 18 18   Temp:  97.6 F (36.4 C) 98.8 F (37.1 C) 97.9 F (36.6 C) 98 F (36.7 C)  TempSrc: Oral Oral Oral Oral  SpO2: 100%  100% 100%  Weight:      Height:       General: alert, cooperative and no distress Lochia: appropriate Uterine Fundus: firm Incision: N/A DVT Evaluation: No evidence of DVT seen on physical exam. Labs: Lab Results  Component Value Date   WBC 9.6 07/09/2019   HGB 9.5 (L) 07/09/2019   HCT 29.0 (L) 07/09/2019   MCV 88.7 07/09/2019   PLT 138 (L) 07/09/2019   CMP Latest Ref Rng & Units 12/06/2018  Glucose 70 - 99 mg/dL 89  BUN 6 - 20 mg/dL <5(L)  Creatinine 0.44 - 1.00 mg/dL 0.41(L)  Sodium 135 - 145 mmol/L 134(L)  Potassium 3.5 - 5.1 mmol/L 3.3(L)  Chloride 98 - 111 mmol/L 103  CO2 22 - 32 mmol/L 22  Calcium 8.9 - 10.3 mg/dL 9.1  Total Protein 6.5 - 8.1  g/dL 7.4  Total Bilirubin 0.3 - 1.2 mg/dL 0.2(L)  Alkaline Phos 38 - 126 U/L 43  AST 15 - 41 U/L 17  ALT 0 - 44 U/L 14    Discharge instruction: per After Visit Summary and "Baby and Me Booklet".  After visit meds:  Allergies as of 07/09/2019   No Known Allergies      Medication List     TAKE these medications    Accu-Chek Guide w/Device Kit 1 Units by Does not apply route 3 (three) times daily.   Accu-Chek Softclix Lancets lancets Use as instructed   Blood Pressure Monitoring Devi 1 Device by Does not apply route once a week.   glucose blood test strip Use as instructed   insulin lispro 100 UNIT/ML injection Commonly known as: HUMALOG Inject 0.1 mLs (10 Units total) into the skin 3 (three) times daily before meals.   insulin NPH Human 100 UNIT/ML injection Commonly known as: HumuLIN N Inject 0.1 mLs (10 Units total) into the skin 2 (two) times daily before a meal. 15 units in AM, 10 units at night   Insulin Syringe-Needle U-100 30G X 15/64" 0.5 ML Misc 1 Syringe by Does not apply route 4 (four) times daily as needed.   oxyCODONE 5 MG immediate release tablet Commonly known as:  Roxicodone Take 1 tablet (5 mg total) by mouth every 8 (eight) hours as needed for severe pain.   prenatal multivitamin Tabs tablet Take 1 tablet by mouth daily at 12 noon.        Diet: routine diet  Activity: Advance as tolerated. Pelvic rest for 6 weeks.   Outpatient follow up:4 weeks Follow up Appt: Future Appointments  Date Time Provider [place]  08/04/2019  1:55 PM Darlina Rumpf, CNM [facility]   Follow up Visit:  Please schedule this patient for Postpartum visit in: 4 weeks with the following provider: Any provider For C/S patients schedule nurse incision check in weeks 2 weeks: no High risk pregnancy complicated by: GDM Delivery mode:  Vacuum Anticipated Birth Control:  Nexplanon PP Procedures needed:  3rd degree laceration check in 1 week. .  Schedule Integrated BH visit: no   Newborn Data: Live born female  Birth Weight: 3779g  APGAR: , 8/9  Newborn Delivery   Birth date/time: 07/07/2019 04:10:00 Delivery type: Vaginal, Vacuum (Extractor)      Baby Feeding: Bottle Disposition:home with mother   07/09/2019 Wilber Oliphant, MD  Attestation:  I confirm that I have verified the information documented in the resident's note and that I have also personally reperformed the physical exam and all medical decision making activities.  Patient was seen and examined by me also Agree with note Vitals stable Labs stable Fundus firm, lochia within normal limits Perineum healing Ext WNL Ready for discharge  Seabron Spates, CNM

## 2019-07-07 NOTE — Progress Notes (Signed)
Snack and dinner meal has been ordered for patient.

## 2019-07-08 ENCOUNTER — Encounter: Payer: Medicaid Other | Admitting: Obstetrics & Gynecology

## 2019-07-08 DIAGNOSIS — O24424 Gestational diabetes mellitus in childbirth, insulin controlled: Secondary | ICD-10-CM | POA: Diagnosis not present

## 2019-07-08 DIAGNOSIS — Z3046 Encounter for surveillance of implantable subdermal contraceptive: Secondary | ICD-10-CM

## 2019-07-08 DIAGNOSIS — Z975 Presence of (intrauterine) contraceptive device: Secondary | ICD-10-CM

## 2019-07-08 LAB — CBC
HCT: 28.9 % — ABNORMAL LOW (ref 36.0–46.0)
Hemoglobin: 9.7 g/dL — ABNORMAL LOW (ref 12.0–15.0)
MCH: 29.4 pg (ref 26.0–34.0)
MCHC: 33.6 g/dL (ref 30.0–36.0)
MCV: 87.6 fL (ref 80.0–100.0)
Platelets: 133 10*3/uL — ABNORMAL LOW (ref 150–400)
RBC: 3.3 MIL/uL — ABNORMAL LOW (ref 3.87–5.11)
RDW: 13.2 % (ref 11.5–15.5)
WBC: 11.7 10*3/uL — ABNORMAL HIGH (ref 4.0–10.5)
nRBC: 0 % (ref 0.0–0.2)

## 2019-07-08 LAB — GLUCOSE, CAPILLARY
Glucose-Capillary: 68 mg/dL — ABNORMAL LOW (ref 70–99)
Glucose-Capillary: 124 mg/dL — ABNORMAL HIGH (ref 70–99)
Glucose-Capillary: 110 mg/dL — ABNORMAL HIGH (ref 70–99)

## 2019-07-08 MED ORDER — LIDOCAINE HCL 1 % IJ SOLN
0.0000 mL | Freq: Once | INTRAMUSCULAR | Status: AC | PRN
  Administered 2019-07-08: 1 mL via INTRADERMAL
  Filled 2019-07-08: qty 20

## 2019-07-08 MED ORDER — ETONOGESTREL 68 MG ~~LOC~~ IMPL
68.0000 mg | DRUG_IMPLANT | Freq: Once | SUBCUTANEOUS | Status: AC
  Administered 2019-07-08: 68 mg via SUBCUTANEOUS
  Filled 2019-07-08: qty 1

## 2019-07-08 NOTE — Procedures (Signed)
  Procedure Note: Nexplanon insertion  Patient is a 24 y.o. G1P1001 now PPD# 1 from [facility]. She desires long-term reversible contraception.  Risks/benefits/side effects of Nexplanon have been discussed with patient and her questions have been answered. Patient is aware of the common side effect of irregular bleeding, which the incidence of decreases over time.  BP 108/74 (BP Location: Right Arm)   Pulse 71   Temp 97.6 F (36.4 C) (Oral)   Resp 17   Ht 5\' 3"  (1.6 m)   Wt 64.4 kg   LMP 09/27/2018 (Exact Date)   SpO2 100%   Breastfeeding Unknown   BMI 25.15 kg/m   Results for orders placed or performed during the hospital encounter of 07/06/19 (from the past 24 hour(s))  CBC no diff 0500   Collection Time: 07/08/19  6:00 AM  Result Value Ref Range   WBC 11.7 (H) 4.0 - 10.5 K/uL   RBC 3.30 (L) 3.87 - 5.11 MIL/uL   Hemoglobin 9.7 (L) 12.0 - 15.0 g/dL   HCT 07/10/19 (L) 82.9 - 56.2 %   MCV 87.6 80.0 - 100.0 fL   MCH 29.4 26.0 - 34.0 pg   MCHC 33.6 30.0 - 36.0 g/dL   RDW 13.0 86.5 - 78.4 %   Platelets 133 (L) 150 - 400 K/uL   nRBC 0.0 0.0 - 0.2 %  Glucose, capillary   Collection Time: 07/08/19  8:27 AM  Result Value Ref Range   Glucose-Capillary 68 (L) 70 - 99 mg/dL  Glucose, capillary   Collection Time: 07/08/19  8:55 AM  Result Value Ref Range   Glucose-Capillary 124 (H) 70 - 99 mg/dL     She is right-handed, so her left arm, approximately 10 cm proximal from the elbow, was cleansed with alcohol and anesthetized with 3 mL of 1% Lidocaine with Epi.  The area was cleansed again with betadine and the Nexplanon was inserted per manufacturer's recommendations without difficulty.  A pressure bandage were applied.  Pt was instructed to keep the area clean and dry, remove pressure bandage in 24 hours.  She was given a card indicating date Nexplanon was inserted and date it needs to be removed. Follow-up PRN problems.  07/10/19, D.O. OB Fellow  07/08/2019, 11:05 AM

## 2019-07-08 NOTE — Discharge Instructions (Signed)

## 2019-07-08 NOTE — Progress Notes (Signed)
Glucose 68-Dr Earlene Plater is notified. Pt has OJ to drink and will start eating breakfast now. Plan Glucose recheck in 15 minutes. This AM Interpreter # [phone] was used to translate Maternal and infant care, safety, lab values and meds. Patient and husband state understanding information.

## 2019-07-08 NOTE — Progress Notes (Signed)
Pt consumed  4 oz orange juice and breakfast glucose 124.

## 2019-07-08 NOTE — Progress Notes (Addendum)
Post Partum Day 1 Subjective: up ad lib, voiding, tolerating PO, + flatus and is having pain in perineal area.  Objective: Blood pressure 108/74, pulse 71, temperature 97.6 F (36.4 C), temperature source Oral, resp. rate 17, height 5\' 3"  (1.6 m), weight 64.4 kg, last menstrual period 09/27/2018, SpO2 100 %, unknown if currently breastfeeding.  Physical Exam:  General: alert, cooperative and no distress Lochia: appropriate Uterine Fundus: firm External Vaginal exam: No active draining appreciated. TTP. No erythema. Sutures intact.  DVT Evaluation: No evidence of DVT seen on physical exam.  Recent Labs    07/06/19 0740  HGB 13.6  HCT 41.1    Assessment/Plan: Plan for discharge tomorrow, Breastfeeding, Lactation consult and Contraception discussed. Patient desires  Nexplanon. She was consented this morning.   GDM: insulin dependent since 06/11/19. No CBGs since labor.  BID CBGs  Notify MD if elevated  3C Perineal Tear: Has not used any oxy in last 24 hours. Pain scores low on flowsheets. Continues to bleed, but improving. Pad is not soaked on exam.  Continue ibuprofen.  Encouraged to ask for stronger pain medicaiton as needed  Return precautions if discharged today provided.   Blood loss Anemia: Hgb dow to 9.7 from 13.6 this morning. Patient denies any symptoms of dizziness, lightheadedness, tachycardia and her vitals have been stable overnight. EBL 766 yesterday. Patient desired to go home today; however, was aware that is CBC returned low there was a chance she would have to stay.  Continue oral iron BID  Recheck AM CBC tomorrow.    LOS: 2 days   06/13/19 07/08/2019, 5:46 AM   OB FELLOW POSTPARTUM PROGRESS NOTE ATTESTATION  I have seen and examined this patient and agree with above documentation in the resident's note.   07/10/2019, D.O. OB Fellow  07/08/2019, 11:06 AM

## 2019-07-09 LAB — GLUCOSE, CAPILLARY: Glucose-Capillary: 76 mg/dL (ref 70–99)

## 2019-07-09 LAB — CBC
HCT: 29 % — ABNORMAL LOW (ref 36.0–46.0)
Hemoglobin: 9.5 g/dL — ABNORMAL LOW (ref 12.0–15.0)
MCH: 29.1 pg (ref 26.0–34.0)
MCHC: 32.8 g/dL (ref 30.0–36.0)
MCV: 88.7 fL (ref 80.0–100.0)
Platelets: 138 10*3/uL — ABNORMAL LOW (ref 150–400)
RBC: 3.27 MIL/uL — ABNORMAL LOW (ref 3.87–5.11)
RDW: 13.2 % (ref 11.5–15.5)
WBC: 9.6 10*3/uL (ref 4.0–10.5)
nRBC: 0 % (ref 0.0–0.2)

## 2019-07-09 MED ORDER — DOCUSATE SODIUM 100 MG PO CAPS: 100.0000 mg | ORAL_CAPSULE | Freq: Two times a day (BID) | ORAL | 0 refills | Status: DC

## 2019-07-09 MED ORDER — IBUPROFEN 600 MG PO TABS: 600.0000 mg | ORAL_TABLET | Freq: Four times a day (QID) | ORAL | 0 refills | Status: DC

## 2019-07-09 NOTE — Progress Notes (Signed)
Video Language Interpreters used for discharge teaching. Nepali interpreter Dev [phone] and Bryson Corona [phone] (due to a disconnection). Called patients doctors to confirm that pt no longer needed to take insulin as indicated on AVS,. Pt informed to stop blood sugar checks and taking insulin. Patient requested Motrin and Colace to be called to [facility] on [address]. Pharmacy was added in computer and MD/ CNM notified of pt's request.

## 2019-08-04 ENCOUNTER — Ambulatory Visit (INDEPENDENT_AMBULATORY_CARE_PROVIDER_SITE_OTHER): Payer: Medicaid Other | Admitting: Advanced Practice Midwife

## 2019-08-04 ENCOUNTER — Encounter: Payer: Self-pay | Admitting: Advanced Practice Midwife

## 2019-08-04 DIAGNOSIS — O24414 Gestational diabetes mellitus in pregnancy, insulin controlled: Secondary | ICD-10-CM

## 2019-08-04 NOTE — Progress Notes (Deleted)
I connected with@ on 08/04/19 at  1:55 PM EST by: *** and verified that I am speaking with the correct person using two identifiers.  Patient is located at *** and provider is located at ***.     The purpose of this virtual visit is to provide medical care while limiting exposure to the novel coronavirus. I discussed the limitations, risks, security and privacy concerns of performing an evaluation and management service by *** and the availability of in person appointments. I also discussed with the patient that there may be a patient responsible charge related to this service. By engaging in this virtual visit, you consent to the provision of healthcare.  Additionally, you authorize for your insurance to be billed for the services provided during this visit.  The patient expressed understanding and agreed to proceed.  The following staff members participated in the virtual visit:  ***  Post Partum Visit Note Subjective:    Ms. Zakyia Gagan is a 24 y.o. G94P1001 female who presents for a postpartum visit. She is 4 weeks postpartum following a spontaneous vaginal delivery. I have fully reviewed the prenatal and intrapartum course. The delivery was at 39/1 gestational weeks. Outcome: spontaneous vaginal delivery. Anesthesia: none. Postpartum course has been ***. Baby's course has been ***. Baby is feeding by bottle - Carnation Good Start. Bleeding staining only. Bowel function is normal. Bladder function is normal. Patient is not sexually active. Contraception method is Nexplanon. Postpartum depression screening: negative.  {Common ambulatory SmartLinks:19316}  Review of Systems {ros; complete:30496}   Objective:  There were no vitals filed for this visit. Self-Obtained       Assessment:    *** postpartum exam. Pap smear not done at today's visit. Last pap smear *** and results were ***. Next pap due ***.   Plan:    1. Contraception: {method:5051} 2. *** 3. Follow up in: {1-10:13787} {time;  units:19136} or as needed.   *** minutes of non-face-to-face time spent with the patient   Henrietta Dine [facility] 08/04/2019 1:37 PM

## 2019-08-04 NOTE — Patient Instructions (Signed)
Preventive Care 21-26 Years Old, Female Preventive care refers to visits with your health care provider and lifestyle choices that can promote health and wellness. This includes:  A yearly physical exam. This may also be called an annual well check.  Regular dental visits and eye exams.  Immunizations.  Screening for certain conditions.  Healthy lifestyle choices, such as eating a healthy diet, getting regular exercise, not using drugs or products that contain nicotine and tobacco, and limiting alcohol use. What can I expect for my preventive care visit? Physical exam Your health care provider will check your:  Height and weight. This may be used to calculate body mass index (BMI), which tells if you are at a healthy weight.  Heart rate and blood pressure.  Skin for abnormal spots. Counseling Your health care provider may ask you questions about your:  Alcohol, tobacco, and drug use.  Emotional well-being.  Home and relationship well-being.  Sexual activity.  Eating habits.  Work and work environment.  Method of birth control.  Menstrual cycle.  Pregnancy history. What immunizations do I need?  Influenza (flu) vaccine  This is recommended every year. Tetanus, diphtheria, and pertussis (Tdap) vaccine  You may need a Td booster every 10 years. Varicella (chickenpox) vaccine  You may need this if you have not been vaccinated. Human papillomavirus (HPV) vaccine  If recommended by your health care provider, you may need three doses over 6 months. Measles, mumps, and rubella (MMR) vaccine  You may need at least one dose of MMR. You may also need a second dose. Meningococcal conjugate (MenACWY) vaccine  One dose is recommended if you are age 19-21 years and a first-year college student living in a residence hall, or if you have one of several medical conditions. You may also need additional booster doses. Pneumococcal conjugate (PCV13) vaccine  You may need  this if you have certain conditions and were not previously vaccinated. Pneumococcal polysaccharide (PPSV23) vaccine  You may need one or two doses if you smoke cigarettes or if you have certain conditions. Hepatitis A vaccine  You may need this if you have certain conditions or if you travel or work in places where you may be exposed to hepatitis A. Hepatitis B vaccine  You may need this if you have certain conditions or if you travel or work in places where you may be exposed to hepatitis B. Haemophilus influenzae type b (Hib) vaccine  You may need this if you have certain conditions. You may receive vaccines as individual doses or as more than one vaccine together in one shot (combination vaccines). Talk with your health care provider about the risks and benefits of combination vaccines. What tests do I need?  Blood tests  Lipid and cholesterol levels. These may be checked every 5 years starting at age 20.  Hepatitis C test.  Hepatitis B test. Screening  Diabetes screening. This is done by checking your blood sugar (glucose) after you have not eaten for a while (fasting).  Sexually transmitted disease (STD) testing.  BRCA-related cancer screening. This may be done if you have a family history of breast, ovarian, tubal, or peritoneal cancers.  Pelvic exam and Pap test. This may be done every 3 years starting at age 21. Starting at age 30, this may be done every 5 years if you have a Pap test in combination with an HPV test. Talk with your health care provider about your test results, treatment options, and if necessary, the need for more tests.   Follow these instructions at home: Eating and drinking   Eat a diet that includes fresh fruits and vegetables, whole grains, lean protein, and low-fat dairy.  Take vitamin and mineral supplements as recommended by your health care provider.  Do not drink alcohol if: ? Your health care provider tells you not to drink. ? You are  pregnant, may be pregnant, or are planning to become pregnant.  If you drink alcohol: ? Limit how much you have to 0-1 drink a day. ? Be aware of how much alcohol is in your drink. In the [place] one drink equals one 12 oz bottle of beer (355 mL), one 5 oz glass of wine (148 mL), or one 1 oz glass of hard liquor (44 mL). Lifestyle  Take daily care of your teeth and gums.  Stay active. Exercise for at least 30 minutes on 5 or more days each week.  Do not use any products that contain nicotine or tobacco, such as cigarettes, e-cigarettes, and chewing tobacco. If you need help quitting, ask your health care provider.  If you are sexually active, practice safe sex. Use a condom or other form of birth control (contraception) in order to prevent pregnancy and STIs (sexually transmitted infections). If you plan to become pregnant, see your health care provider for a preconception visit. What's next?  Visit your health care provider once a year for a well check visit.  Ask your health care provider how often you should have your eyes and teeth checked.  Stay up to date on all vaccines. This information is not intended to replace advice given to you by your health care provider. Make sure you discuss any questions you have with your health care provider. Document Revised: 02/20/2018 Document Reviewed: 02/20/2018 Elsevier Patient Education  2020 [employer].

## 2019-08-04 NOTE — Progress Notes (Signed)
Post Partum Exam  Regina Hayes is a 24 y.o. G31P1001 female who presents for a postpartum visit. She is 4 weeks postpartum following a spontaneous vaginal delivery with third degree pernineal laceration. I have fully reviewed the prenatal and intrapartum course. The delivery was at 39.1 gestational weeks.  Anesthesia: none. Postpartum course has been unremarkable. Baby's course has been unremarkable. Baby is feeding by bottle - Carnation Good Start. Bleeding no bleeding. Bowel function is normal. Bladder function is normal. Patient is not sexually active. Contraception method is Nexplanon. Postpartum depression screening:neg  The following portions of the patient's history were reviewed and updated as appropriate: allergies, current medications, past family history, past medical history, past social history, past surgical history and problem list. Last pap smear done 12/22/2018 and was Normal  Review of Systems Pertinent items noted in HPI and remainder of comprehensive ROS otherwise negative.    Objective:  Last menstrual period 09/27/2018, unknown if currently breastfeeding.  General:  alert, cooperative, appears stated age and no distress   Breasts:  inspection negative, no nipple discharge or bleeding, no masses or nodularity palpable  Lungs: clear to auscultation bilaterally  Heart:  regular rate and rhythm, S1, S2 normal, no murmur, click, rub or gallop  Abdomen: soft, non-tender; bowel sounds normal; no masses,  no organomegaly   Vulva:  normal  Vagina: normal vagina  Cervix:  no cervical motion tenderness and no lesions  Corpus: normal  Adnexa:  no mass, fullness, tenderness  Rectal Exam: Normal rectovaginal exam        Assessment:    No concerning findings on postpartum exam. Repair of third degree well approximated. Pap smear not performed at today's visit.   Plan:   1. Contraception: Nexplanon 2. Pregnancy c/b A2GDM. Patient needs fasting 2 hour Hour GTT 3. Follow up in:  1 week or as needed.  4. Language barrier. Nepali interpreter utilized for all patient interaction  Clayton Bibles, MSN, CNM Certified Nurse Midwife, [employer] for [employer], [facility] 08/04/19 3:06 PM

## 2019-08-06 ENCOUNTER — Other Ambulatory Visit: Payer: Self-pay | Admitting: *Deleted

## 2019-08-06 DIAGNOSIS — Z8632 Personal history of gestational diabetes: Secondary | ICD-10-CM

## 2019-08-11 ENCOUNTER — Other Ambulatory Visit: Payer: Medicaid Other

## 2019-08-11 ENCOUNTER — Other Ambulatory Visit: Payer: Self-pay

## 2019-08-11 DIAGNOSIS — Z8632 Personal history of gestational diabetes: Secondary | ICD-10-CM

## 2019-08-12 ENCOUNTER — Encounter: Payer: Self-pay | Admitting: Advanced Practice Midwife

## 2019-08-12 DIAGNOSIS — R7309 Other abnormal glucose: Secondary | ICD-10-CM | POA: Insufficient documentation

## 2019-08-12 LAB — GLUCOSE TOLERANCE, 2 HOURS
Glucose, 2 hour: 295 mg/dL — ABNORMAL HIGH (ref 65–139)
Glucose, GTT - Fasting: 133 mg/dL — ABNORMAL HIGH (ref 65–99)

## 2019-08-12 NOTE — Progress Notes (Signed)
Elevated readings on postpartum glucose. Please notify patient, refer to nutrition and if she has PCP she needs to follow up with them. SW, CNM

## 2019-08-14 ENCOUNTER — Telehealth: Payer: Self-pay | Admitting: Lactation Services

## 2019-08-14 DIAGNOSIS — Z8632 Personal history of gestational diabetes: Secondary | ICD-10-CM

## 2019-08-14 DIAGNOSIS — R7309 Other abnormal glucose: Secondary | ICD-10-CM

## 2019-08-14 NOTE — Telephone Encounter (Signed)
-----   Message from Calvert Cantor, [place] sent at 08/12/2019  9:42 AM EST ----- Elevated readings on postpartum glucose. Please notify patient, refer to nutrition and if she has PCP she needs to follow up with them. SW, CNM

## 2019-08-14 NOTE — Telephone Encounter (Signed)
Called patient with assistance of [employer] Nepali interpreter, # [phone].   Ordered Diabetes and Nutrition consult per App.   Called patients and her husbands phone numbers listed. No answer and voicemail not set up so unable to leave a message.

## 2019-08-19 NOTE — Telephone Encounter (Addendum)
I called Regina Hayes with [employer] [phone] to her mobile number and line was busy. I called her home number and heard message voice mail not set up. Will send letter to notify patient. Also is unknown if she has a PCP- will make referral to [facility].  Marshaun Lortie,RN

## 2019-08-19 NOTE — Addendum Note (Signed)
Addended by: Gerome Apley on: 08/19/2019 11:32 AM   Modules accepted: Orders

## 2019-08-27 ENCOUNTER — Other Ambulatory Visit (HOSPITAL_COMMUNITY): Payer: Self-pay | Admitting: Lactation Services

## 2019-08-27 DIAGNOSIS — R739 Hyperglycemia, unspecified: Secondary | ICD-10-CM

## 2019-08-27 NOTE — Lactation Note (Signed)
Lactation Consultation Note  Patient Name: Regina Hayes [id number] Date: 08/27/2019     Maternal Data    Feeding    LATCH Score                   Interventions    Lactation Tools Discussed/Used     Consult Status      Silas Flood [place] 08/27/2019, 9:30 AM

## 2019-09-01 ENCOUNTER — Other Ambulatory Visit: Payer: Self-pay | Admitting: *Deleted

## 2019-09-01 DIAGNOSIS — R7301 Impaired fasting glucose: Secondary | ICD-10-CM

## 2019-09-10 ENCOUNTER — Ambulatory Visit: Payer: Medicaid Other | Admitting: Registered"

## 2019-09-10 ENCOUNTER — Other Ambulatory Visit: Payer: Self-pay

## 2019-09-10 ENCOUNTER — Encounter: Payer: Medicaid Other | Attending: Obstetrics and Gynecology | Admitting: Registered"

## 2019-09-10 DIAGNOSIS — O24419 Gestational diabetes mellitus in pregnancy, unspecified control: Secondary | ICD-10-CM | POA: Insufficient documentation

## 2019-09-10 DIAGNOSIS — Z713 Dietary counseling and surveillance: Secondary | ICD-10-CM | POA: Diagnosis not present

## 2019-09-10 DIAGNOSIS — O0990 Supervision of high risk pregnancy, unspecified, unspecified trimester: Secondary | ICD-10-CM | POA: Insufficient documentation

## 2019-09-10 DIAGNOSIS — O99815 Abnormal glucose complicating the puerperium: Secondary | ICD-10-CM | POA: Insufficient documentation

## 2019-09-10 NOTE — Progress Notes (Signed)
Medical Nutrition Therapy:  Appt start time: 0915 end time:  1030.   Assessment:  Primary concerns today: Elevated postpartum OGTT. Lab 08/11/19, 2 hr OGTT: FBS 133 mg/dL; 2 hr 213 mg/dL  Patient states diet is traditional [place] cuisine which is mostly based on rice. Patient eats meat 2-3/x week.  Patient states she has had continued bleeding since 2/12. Patient was offered an appointment later today with a provider, but patient states she cannot return today and said she will schedule for next week for evaluation.  MEDICATIONS: none   DIETARY INTAKE:  24-hr recall:  B ( AM): flavored coffee and biscuit or rice  Snk ( AM): none  L ( PM): rice (more than 1 cup), vegetables, lentils Snk ( PM):  D ( PM): [place]e dish- Chapate: potatoes, non-starchy vegetables, instant noodles, lentils Snk ( PM):  Beverages: water, coffee, coke occasionally - likes to drink it when she is hot  Usual physical activity: not assessed  Estimated energy needs:  Progress Towards Goal(s):  New goals.   Nutritional Diagnosis:  NI-5.8.2 Excessive carbohydrate intake As related to relying on rice for food staple, maybe soda intake.  As evidenced by dietary recall. 2 hr OGTT: 295 mg/dL.    Intervention:  Nutrition Education. Discussed evidence of increased risk for type 2 diabetes after GDM. Timing of self-monitoring blood sugar. Food groups and relationship to myplate.    Teaching Method Utilized:  Visual Auditory Hands on  Handouts given during visit include:  none  Barriers to learning/adherence to lifestyle change: cultural food preferences may make dietary changes difficult  Demonstrated degree of understanding via:  Teach Back   Monitoring/Evaluation:  Dietary intake, exercise, blood sugar log.

## 2019-09-10 NOTE — Patient Instructions (Signed)
Plan:  Make some changes to your diet: Include protein with breakfast, have less rice Change your Chapate recipe to reduce carbohydrate and increase protein: less potato, more lentils, include peanuts.  Check blood sugar 2x/day: Fasting, before breakfast and about 1 1/2 hrs - 2 hours after breakfast.  Return for follow-up in 1 week with blood sugar log and the package of the coffee drink you have for breakfast.

## 2019-09-14 ENCOUNTER — Encounter (HOSPITAL_COMMUNITY): Payer: Self-pay | Admitting: Obstetrics & Gynecology

## 2019-09-17 ENCOUNTER — Other Ambulatory Visit: Payer: Self-pay

## 2019-09-17 ENCOUNTER — Encounter: Payer: Self-pay | Admitting: Obstetrics and Gynecology

## 2019-09-17 ENCOUNTER — Ambulatory Visit: Payer: Medicaid Other | Admitting: Registered"

## 2019-09-17 ENCOUNTER — Ambulatory Visit (INDEPENDENT_AMBULATORY_CARE_PROVIDER_SITE_OTHER): Payer: Medicaid Other | Admitting: Obstetrics and Gynecology

## 2019-09-17 ENCOUNTER — Encounter: Payer: Medicaid Other | Admitting: Registered"

## 2019-09-17 VITALS — BP 113/79 | HR 70 | Wt 123.9 lb

## 2019-09-17 DIAGNOSIS — Z713 Dietary counseling and surveillance: Secondary | ICD-10-CM | POA: Diagnosis not present

## 2019-09-17 DIAGNOSIS — N939 Abnormal uterine and vaginal bleeding, unspecified: Secondary | ICD-10-CM | POA: Diagnosis not present

## 2019-09-17 DIAGNOSIS — Z975 Presence of (intrauterine) contraceptive device: Secondary | ICD-10-CM | POA: Diagnosis not present

## 2019-09-17 DIAGNOSIS — O99815 Abnormal glucose complicating the puerperium: Secondary | ICD-10-CM

## 2019-09-17 MED ORDER — NORGESTIMATE-ETH ESTRADIOL 0.25-35 MG-MCG PO TABS: 1.0000 | ORAL_TABLET | Freq: Every day | ORAL | 0 refills | Status: DC

## 2019-09-17 NOTE — Progress Notes (Signed)
Some times heavy  Sometimes spotting  Since delivery  Has Nexplanon

## 2019-09-17 NOTE — Progress Notes (Signed)
[place] Inperson interpreter Alvira Philips  Diabetes Education:  09/17/19 Appt start time: 1325 end time:  1400.  Assessment:  Follow-up for elevated postpartum OGTT. Lab 08/11/19, 2 hr OGTT: FBS 133 mg/dL; 2 hr 650 mg/dL  Review of blood sugar log x1/week FBS: 158-219 - mostly above 200 mg/dL Post breakfast: 354-656 - mostly around 200.  RD encourage patient to reduce rice intake and to get established with a primary care doctor to manage diabetes. Patient does not like the idea of using insulin to control blood sugar due to burden of having to give multiple daily injections.  MEDICATIONS: none   DIETARY INTAKE:  24-hr recall:  B ( AM): flavored coffee, eggs, bread Snk ( AM): none  L ( PM): rice (likely still more than 1 cup), vegetables, lentils Snk ( PM):  D ( PM): Chapate: non-starchy vegetables, instant noodles, lentils (omitted potato from recipe) Snk ( PM):  Beverages: water, coffee  Usual physical activity: not assessed  Estimated energy needs:  Progress Towards Goal(s):  New goals.   Nutritional Diagnosis:  NI-5.8.2 Excessive carbohydrate intake As related to relying on rice for food staple, maybe soda intake.  As evidenced by dietary recall. 2 hr OGTT: 295 mg/dL.    Intervention:  Nutrition Education. Discussed need for PCP to manage diabetes and options that may be discussed with her primary care MD. Discussed complications that can occur with untreated diabetes. Interpreter explained to Pt how to apply for [employer] and gave [employer] contact information.   Teaching Method Utilized:  Visual Auditory Hands on  Handouts given during visit include:  none  Barriers to learning/adherence to lifestyle change: cultural food preferences may make dietary changes difficult  Demonstrated degree of understanding via:  Teach Back   Monitoring/Evaluation:  Dietary intake, exercise, blood sugar log.

## 2019-09-17 NOTE — Progress Notes (Signed)
    GYNECOLOGY  ENCOUNTER NOTE  History:     Regina Hayes is a 24 y.o. G57P1001 female here with abnormal vaginal bleeding. No recent intercourse, no intercourse since delivery. She is status post vaginal delivery on 07/07/2019 with 3rd degree laceration from delivery, feel's like it has healed. Every day bleeding. Waxes and Wanes from heavy to light. No dizziness. She is not breastfeeding. The bleeding is dark in color.   OB History  Gravida Para Term Preterm AB Living  '1 1 1     1  '$ SAB TAB Ectopic Multiple Live Births        0 1    # Outcome Date GA Lbr Len/2nd Weight Sex Delivery Anes PTL Lv  1 Term 07/07/19 [id number] / 01:43 8 lb 5.3 oz (3.779 kg) F Vag-Vacuum None  LIV    Past Medical History:  Diagnosis Date  . Diabetes mellitus without complication ([place]   . Gestational diabetes   . Medical history non-contributory     Past Surgical History:  Procedure Laterality Date  . NO PAST SURGERIES      Current Outpatient Medications on File Prior to Visit  Medication Sig Dispense Refill  . Accu-Chek Softclix Lancets lancets Use as instructed 100 each 2  . Blood Glucose Monitoring Suppl (ACCU-CHEK GUIDE) w/Device KIT 1 Units by Does not apply route 3 (three) times daily. 1 kit 0  . Blood Pressure Monitoring DEVI 1 Device by Does not apply route once a week. 1 Device 0  . docusate sodium (COLACE) 100 MG capsule Take 1 capsule (100 mg total) by mouth 2 (two) times daily. (Patient not taking: Reported on 08/04/2019) 10 capsule 0  . ibuprofen (ADVIL) 600 MG tablet Take 1 tablet (600 mg total) by mouth every 6 (six) hours. (Patient not taking: Reported on 08/04/2019) 30 tablet 0  . oxyCODONE (ROXICODONE) 5 MG immediate release tablet Take 1 tablet (5 mg total) by mouth every 8 (eight) hours as needed for severe pain. (Patient not taking: Reported on 08/04/2019) 20 tablet 0  . Prenatal Vit-Fe Fumarate-FA (PRENATAL MULTIVITAMIN) TABS tablet Take 1 tablet by mouth daily at 12 noon.     No  current facility-administered medications on file prior to visit.    No Known Allergies  Social History:  reports that she has never smoked. She has never used smokeless tobacco. She reports previous alcohol use. She reports that she does not use drugs.  No family history on file.  The following portions of the patient's history were reviewed and updated as appropriate: allergies, current medications, past family history, past medical history, past social history, past surgical history and problem list.  Review of Systems Pertinent items noted in HPI and remainder of comprehensive ROS otherwise negative.  Physical Exam:  BP 113/79   Pulse 70   Wt 123 lb 14.4 oz (56.2 kg)   LMP 09/27/2018 (Exact Date)   BMI 21.95 kg/m  CONSTITUTIONAL: Well-developed, well-nourished female in no acute distress.  HENT:  Normocephalic. EYES: Conjunctivae and EOM are normal. Pupils are equal, round, and reactive to light. SKIN: Skin is warm and dry. No rash noted. Not diaphoretic. No erythema. No pallor. MUSCULOSKELETAL: Normal range of motion.    Assessment and Plan:   1. Abnormal vaginal bleeding  Rx: Sprintec Discussed normalcy of bleeding with nexplanon in place Return if symptoms do not improve.  2. Nexplanon in place   Trooper Olander, JArtist Pais [place]

## 2019-09-21 ENCOUNTER — Other Ambulatory Visit: Payer: Self-pay

## 2019-09-21 NOTE — Telephone Encounter (Signed)
Received fax from [facility] requesting a refill on pt's BCP's. Per chart review pt was prescribed only 3 packs so that it would stop the bleeding from Nexplanon.  Fax sent back to [facility] stating denial and that pt will need to contact office for refill.    Addison Naegeli, RN

## 2019-09-23 ENCOUNTER — Other Ambulatory Visit: Payer: Self-pay

## 2019-12-25 DIAGNOSIS — Z9049 Acquired absence of other specified parts of digestive tract: Secondary | ICD-10-CM

## 2019-12-25 DIAGNOSIS — K3580 Unspecified acute appendicitis: Secondary | ICD-10-CM | POA: Insufficient documentation

## 2019-12-25 HISTORY — DX: Acquired absence of other specified parts of digestive tract: Z90.49

## 2020-01-07 ENCOUNTER — Encounter (HOSPITAL_COMMUNITY): Payer: Self-pay | Admitting: Pediatrics

## 2020-01-07 ENCOUNTER — Emergency Department (HOSPITAL_COMMUNITY)
Admission: EM | Admit: 2020-01-07 | Discharge: 2020-01-07 | Disposition: A | Discharge status: Home / Self Care | Payer: Medicaid Other | Attending: Emergency Medicine | Admitting: Emergency Medicine

## 2020-01-07 ENCOUNTER — Other Ambulatory Visit: Payer: Self-pay

## 2020-01-07 DIAGNOSIS — Z9089 Acquired absence of other organs: Secondary | ICD-10-CM | POA: Insufficient documentation

## 2020-01-07 DIAGNOSIS — E119 Type 2 diabetes mellitus without complications: Secondary | ICD-10-CM | POA: Insufficient documentation

## 2020-01-07 DIAGNOSIS — Z4803 Encounter for change or removal of drains: Secondary | ICD-10-CM | POA: Diagnosis not present

## 2020-01-07 DIAGNOSIS — G8918 Other acute postprocedural pain: Secondary | ICD-10-CM | POA: Diagnosis not present

## 2020-01-07 DIAGNOSIS — Z79899 Other long term (current) drug therapy: Secondary | ICD-10-CM | POA: Diagnosis not present

## 2020-01-07 NOTE — ED Triage Notes (Signed)
Family at bedside. Reported had appendix removed on 12/25/2019 was done in [place] while on vacation. Patient presents to ED w/ JP drain in place and discharge papers from the facility.

## 2020-01-07 NOTE — Discharge Instructions (Signed)
It was a pleasure meeting you today. Please call [phone]  or [phone] to find a primary care doctor. We recommend you try to schedule an appointment within the next week to establish care. If you develop fever/chills, chest pain, shortness of breath, etc., return to the emergency department for evaluation.   ?? ??????? ????? ???? ??????? ????? care [phone]??? ?? ?[phone] ???????? ??? ?????? ????? ??????? ???? ?? ?????????? ???? ??????? ??????? ?? ????? ????? ????? ????? ?????? ????????? ???? ?? ???? ???? ??????? ???? ????? ?????????? ??? ????? ????? / ????, ???? ??????, ??? ????? ??? ????? ???????? ???, ??????? ??????? ???: ??????????? ???? ???????????

## 2020-01-07 NOTE — ED Provider Notes (Signed)
[facility] EMERGENCY DEPARTMENT Provider Note   CSN: [id number] Arrival date & time: 01/07/20  1421     History Chief Complaint  Patient presents with  . Post-op Problem    Regina Hayes is a 24 y.o. Nepali-speaking woman with history of gestational diabetes who presents to the ED two weeks after appendectomy for evaluation for surgical drain removal.  The history was obtained from the patient and her husband using a Nepali video interpreter as well as records from [place].  The patient had an appendectomy on 12/25/19 at a hospital while [place] in [place], [place]. According to their records, on POD 1 she was noted to have seropurulent material in her JP drain as well as nausea and emesis. Despite recommendation to stay at the hospital for continued observation, the patient insisted on returning home to [facility]. According to their notes, the patient was advised to follow-up with either a primary care physician or at an ED 1-week post-op.  On evaluation, the patient's only question/concern is when the drain can be removed. She endorses mild incisional pain for which she has been taking prescribed oxycodone 5 mg, once or twice a day as needed. States she has been eating and drinking normally, ambulates well, and is having regular BMs. Denies fever/chills, HA, chest pain, shortness of breath, weakness, drainage from the incisions or around the drain site. Reports having finished her 10-day course of antibiotics.      Past Medical History:  Diagnosis Date  . Diabetes mellitus without complication ([place])   . Gestational diabetes   . Medical history non-contributory   . S/P appendectomy 12/25/2019    Patient Active Problem List   Diagnosis Date Noted  . Abnormal vaginal bleeding 09/17/2019  . Abnormal maternal glucose tolerance, postpartum condition 09/10/2019  . Elevated glucose tolerance test 08/12/2019  . Nexplanon in place 07/08/2019  . Encounter for  induction of labor 07/06/2019  . Supervision of high risk pregnancy, antepartum 05/07/2019  . Gestational diabetes mellitus (GDM), antepartum 05/07/2019  . Language barrier affecting health care 05/07/2019    Past Surgical History:  Procedure Laterality Date  . NO PAST SURGERIES       OB History    Gravida  1   Para  1   Term  1   Preterm      AB      Living  1     SAB      TAB      Ectopic      Multiple  0   Live Births  1           No family history on file.  Social History   Tobacco Use  . Smoking status: Never Smoker  . Smokeless tobacco: Never Used  Vaping Use  . Vaping Use: Never used  Substance Use Topics  . Alcohol use: Not Currently  . Drug use: Never    Home Medications Prior to Admission medications   Medication Sig Start Date End Date Taking? Authorizing Provider  Accu-Chek Softclix Lancets lancets Use as instructed 06/11/19   Sloan Leiter, MD  Blood Glucose Monitoring Suppl (ACCU-CHEK GUIDE) w/Device KIT 1 Units by Does not apply route 3 (three) times daily. 05/28/19   Chancy Milroy, MD  Blood Pressure Monitoring DEVI 1 Device by Does not apply route once a week. 05/08/19   Woodroe Mode, MD  docusate sodium (COLACE) 100 MG capsule Take 1 capsule (100 mg total) by mouth 2 (two)  times daily. Patient not taking: Reported on 08/04/2019 07/09/19   Tamala Julian, [place], CNM  ibuprofen (ADVIL) 600 MG tablet Take 1 tablet (600 mg total) by mouth every 6 (six) hours. Patient not taking: Reported on 08/04/2019 07/09/19   Tamala Julian, [place], CNM  norgestimate-ethinyl estradiol (ORTHO-CYCLEN) 0.25-35 MG-MCG tablet Take 1 tablet by mouth daily. 09/17/19   Rasch, Anderson Malta I, NP  oxyCODONE (ROXICODONE) 5 MG immediate release tablet Take 1 tablet (5 mg total) by mouth every 8 (eight) hours as needed for severe pain. Patient not taking: Reported on 08/04/2019 07/07/19 07/06/20  Gladys Damme, MD  Prenatal Vit-Fe Fumarate-FA (PRENATAL MULTIVITAMIN) TABS tablet  Take 1 tablet by mouth daily at 12 noon.    [provider]    Allergies    Patient has no known allergies.  Review of Systems   Review of Systems  All other systems reviewed and are negative. 10 systems reviewed and are negative for acute changes, except as noted in the HPI.   Physical Exam Updated Vital Signs BP 106/75   Pulse 62   Temp 98.3 F (36.8 C) (Oral)   Resp 18   Ht 5' 2"  (1.575 m)   SpO2 99%   BMI 22.66 kg/m   Physical Exam Constitutional:      General: She is not in acute distress.    Appearance: She is not ill-appearing.  HENT:     Head: Normocephalic and atraumatic.     Mouth/Throat:     Mouth: Mucous membranes are moist.  Eyes:     Conjunctiva/sclera: Conjunctivae normal.     Pupils: Pupils are equal, round, and reactive to light.  Cardiovascular:     Rate and Rhythm: Normal rate and regular rhythm.     Pulses: Normal pulses.     Heart sounds: Normal heart sounds.  Pulmonary:     Effort: Pulmonary effort is normal.     Breath sounds: Normal breath sounds.  Abdominal:     General: A surgical scar is present. Bowel sounds are normal. There is no distension.     Palpations: Abdomen is soft.     Tenderness: There is abdominal tenderness.       Comments: Slight tenderness to palpation around umbilical incision. Incisions are clean/dry/intact. JP drain with small amount of serosanguinous output.  Musculoskeletal:     Right lower leg: No edema.     Left lower leg: No edema.  Skin:    General: Skin is warm and dry.  Neurological:     Mental Status: She is alert and oriented to person, place, and time.     ED Results / Procedures / Treatments   Labs (all labs ordered are listed, but only abnormal results are displayed) Labs Reviewed - No data to display  EKG None  Radiology No results found.  Procedures Procedures (including critical care time) JP drain removal Performing provider: Alexandria Lodge, MD Authorizing/Supervising  provider: Carmin Muskrat, MD  Medications Ordered in ED Medications - No data to display  ED Course  I have reviewed the triage vital signs and the nursing notes.  Pertinent labs & imaging results that were available during my care of the patient were reviewed by me and considered in my medical decision making (see chart for details).    MDM Rules/Calculators/A&P                          Regina Hayes is a 24 y.o. Nepali-speaking woman with history of gestational  diabetes who presents to the ED two weeks after an appendectomy at an OSH for evaluation for surgical drain removal.  Patient is well-appearing with stable vitals. Her pain is well-controlled. She is eating and drinking at baseline, ambulating well. Laparoscopic surgical incisions are clean/dry/intact, and JP drain has small amount of serosanguinous output. Discussed with general surgery who agree it is appropriate to remove the JP drain at this time and recommend patient follow-up with a PCP. JP drain removal at bedside was well-tolerated by patient. Patient provided with information to find PCP and discharged home with return precautions.  Final Clinical Impression(s) / ED Diagnoses Final diagnoses:  Change or removal of drains    Rx / DC Orders ED Discharge Orders    None       Alexandria Lodge, MD 01/07/20 2246    Carmin Muskrat, MD 01/08/20 0001

## 2020-01-15 ENCOUNTER — Other Ambulatory Visit: Payer: Self-pay

## 2020-01-15 ENCOUNTER — Ambulatory Visit (HOSPITAL_COMMUNITY)
Admission: EM | Admit: 2020-01-15 | Discharge: 2020-01-15 | Disposition: A | Discharge status: Home / Self Care | Payer: Medicaid Other | Attending: Family Medicine | Admitting: Family Medicine

## 2020-01-15 DIAGNOSIS — Z09 Encounter for follow-up examination after completed treatment for conditions other than malignant neoplasm: Secondary | ICD-10-CM

## 2020-01-15 NOTE — ED Provider Notes (Signed)
[facility]    CSN: [id number] Arrival date & time: 01/15/20  1122      History   Chief Complaint No chief complaint on file.   HPI Regina Hayes is a 23 y.o. female.   Regina Hayes presents with family with requests for follow up, following a recent appendectomy. She was vacationing in [place] when she ultimately was admitted for laparoscopic appendectomy, on 7/2. She left the hospital to return to [place]. She was recommended to follow up with a PCP, she does not have a PCP however. She presented to [facility] on 7/15 and her drain was removed and surgery was consulted at that time. She states that she has since been unable to get a PCP follow up. Her pain continues to improve. She is eating, drinking, voiding and having normal stools. No fever. Lap puncture sites healing without redness or drainage. She has already completed provided antibiotics.    ROS per HPI, negative if not otherwise mentioned.      Past Medical History:  Diagnosis Date  . Diabetes mellitus without complication ([facility])   . Gestational diabetes   . Medical history non-contributory   . S/P appendectomy 12/25/2019    Patient Active Problem List   Diagnosis Date Noted  . Abnormal vaginal bleeding 09/17/2019  . Abnormal maternal glucose tolerance, postpartum condition 09/10/2019  . Elevated glucose tolerance test 08/12/2019  . Nexplanon in place 07/08/2019  . Encounter for induction of labor 07/06/2019  . Supervision of high risk pregnancy, antepartum 05/07/2019  . Gestational diabetes mellitus (GDM), antepartum 05/07/2019  . Language barrier affecting health care 05/07/2019    Past Surgical History:  Procedure Laterality Date  . NO PAST SURGERIES      OB History    Gravida  1   Para  1   Term  1   Preterm      AB      Living  1     SAB      TAB      Ectopic      Multiple  0   Live Births  1            Home Medications    Prior to Admission medications     Medication Sig Start Date End Date Taking? Authorizing Provider  Accu-Chek Softclix Lancets lancets Use as instructed 06/11/19   Davis, Kelly M, MD  Blood Glucose Monitoring Suppl (ACCU-CHEK GUIDE) w/Device KIT 1 Units by Does not apply route 3 (three) times daily. 05/28/19   Ervin, Michael L, MD  Blood Pressure Monitoring DEVI 1 Device by Does not apply route once a week. 05/08/19   Arnold, James G, MD  docusate sodium (COLACE) 100 MG capsule Take 1 capsule (100 mg total) by mouth 2 (two) times daily. Patient not taking: Reported on 08/04/2019 07/09/19   Smith, [place], CNM  ibuprofen (ADVIL) 600 MG tablet Take 1 tablet (600 mg total) by mouth every 6 (six) hours. Patient not taking: Reported on 08/04/2019 07/09/19   Smith, [place], CNM  norgestimate-ethinyl estradiol (ORTHO-CYCLEN) 0.25-35 MG-MCG tablet Take 1 tablet by mouth daily. 09/17/19   Rasch, Jennifer I, NP  oxyCODONE (ROXICODONE) 5 MG immediate release tablet Take 1 tablet (5 mg total) by mouth every 8 (eight) hours as needed for severe pain. Patient not taking: Reported on 08/04/2019 07/07/19 07/06/20  Mahoney, Caitlin, MD  Prenatal Vit-Fe Fumarate-FA (PRENATAL MULTIVITAMIN) TABS tablet Take 1 tablet by mouth daily at 12 noon.    [provider]    Family History No family history on file.  Social History Social History   Tobacco Use  . Smoking status: Never Smoker  . Smokeless tobacco: Never Used  Vaping Use  . Vaping Use: Never used  Substance Use Topics  . Alcohol use: Not Currently  . Drug use: Never     Allergies   Patient has no known allergies.   Review of Systems Review of Systems   Physical Exam Triage Vital Signs ED Triage Vitals  Enc Vitals Group     BP 01/15/20 1217 102/71     Pulse Rate 01/15/20 1217 76     Resp 01/15/20 1217 16     Temp 01/15/20 1217 98 F (36.7 C)     Temp src --      SpO2 01/15/20 1217 100 %     Weight --      Height --      Head Circumference --      Peak Flow --       Pain Score 01/15/20 1257 0     Pain Loc --      Pain Edu? --      Excl. in [place]? --    No data found.  Updated Vital Signs BP 102/71   Pulse 76   Temp 98 F (36.7 C)   Resp 16   SpO2 100%   Visual Acuity Right Eye Distance:   Left Eye Distance:   Bilateral Distance:    Right Eye Near:   Left Eye Near:    Bilateral Near:     Physical Exam Constitutional:      General: She is not in acute distress.    Appearance: She is well-developed.  Cardiovascular:     Rate and Rhythm: Normal rate.  Pulmonary:     Effort: Pulmonary effort is normal.  Abdominal:       Comments: Three puncture sites, well healing. No redness, no drainage; non tender   Skin:    General: Skin is warm and dry.  Neurological:     Mental Status: She is alert and oriented to person, place, and time.      UC Treatments / Results  Labs (all labs ordered are listed, but only abnormal results are displayed) Labs Reviewed - No data to display  EKG   Radiology No results found.  Procedures Procedures (including critical care time)  Medications Ordered in UC Medications - No data to display  Initial Impression / Assessment and Plan / UC Course  I have reviewed the triage vital signs and the nursing notes.  Pertinent labs & imaging results that were available during my care of the patient were reviewed by me and considered in my medical decision making (see chart for details).    No acute findings or red flag findings. Afebrile. Pain improving. Normal BM's, eating and drinking well. Surgery was 7/2. Return precautions provided. Encouraged establish with PCP for prn and preventive needs. Patient verbalized understanding and agreeable to plan.   Final Clinical Impressions(s) / UC Diagnoses   Final diagnoses:  Follow-up examination following surgery     Discharge Instructions     Your exam is reassuring today, your wounds look well.  Continue to cleanse daily with soap and water.  Tylenol  or ibuprofen as needed for pain. Drink plenty of water.  Return or go to the ER for any worsening of pain, fevers, nausea or vomiting   ED Prescriptions    None  PDMP not reviewed this encounter.   Burky, Natalie B, NP 01/15/20 1322  

## 2020-01-15 NOTE — ED Triage Notes (Signed)
[place] is here because she could not get a PCP follow up appointment after surgery. Pt denies any complaints, just "wanting to follow up"

## 2020-01-15 NOTE — Discharge Instructions (Addendum)
Your exam is reassuring today, your wounds look well.  Continue to cleanse daily with soap and water.  Tylenol or ibuprofen as needed for pain. Drink plenty of water.  Return or go to the ER for any worsening of pain, fevers, nausea or vomiting

## 2020-01-21 ENCOUNTER — Ambulatory Visit (HOSPITAL_COMMUNITY)
Admission: EM | Admit: 2020-01-21 | Discharge: 2020-01-21 | Disposition: A | Discharge status: Home / Self Care | Payer: Medicaid Other | Attending: Emergency Medicine | Admitting: Emergency Medicine

## 2020-01-21 ENCOUNTER — Encounter (HOSPITAL_COMMUNITY): Payer: Self-pay

## 2020-01-21 ENCOUNTER — Other Ambulatory Visit: Payer: Self-pay

## 2020-01-21 DIAGNOSIS — T148XXA Other injury of unspecified body region, initial encounter: Secondary | ICD-10-CM

## 2020-01-21 DIAGNOSIS — L24A9 Irritant contact dermatitis due friction or contact with other specified body fluids: Secondary | ICD-10-CM

## 2020-01-21 DIAGNOSIS — T8130XA Disruption of wound, unspecified, initial encounter: Secondary | ICD-10-CM

## 2020-01-21 MED ORDER — DOXYCYCLINE HYCLATE 100 MG PO CAPS: 100.0000 mg | ORAL_CAPSULE | Freq: Two times a day (BID) | ORAL | 0 refills | Status: AC

## 2020-01-21 NOTE — ED Triage Notes (Signed)
[place], is concerned about draining coming from surgical site

## 2020-01-21 NOTE — ED Provider Notes (Signed)
[facility]    CSN: [id number] Arrival date & time: 01/21/20  1540      History   Chief Complaint Chief Complaint  Patient presents with  . Post-op Problem    HPI Regina Hayes is a 24 y.o. female presenting today for evaluation of wound check.  Patient had recent abdomen and ectomy on 7/2, [place], was seen in the emergency room on 7/15 and had drains removed.  She is presenting today as she is concerned about the wound above her umbilicus has been draining fluid.  Symptoms began yesterday.  Denies associated abdominal pain.  She does report some loose mucousy stools since surgery with occasional stomach upset.  Denies blood in stool.  Denies nausea or vomiting.  Denies fevers dizziness or lightheadedness.  HPI  Past Medical History:  Diagnosis Date  . Diabetes mellitus without complication ([place])   . Gestational diabetes   . Medical history non-contributory   . S/P appendectomy 12/25/2019    Patient Active Problem List   Diagnosis Date Noted  . Abnormal vaginal bleeding 09/17/2019  . Abnormal maternal glucose tolerance, postpartum condition 09/10/2019  . Elevated glucose tolerance test 08/12/2019  . Nexplanon in place 07/08/2019  . Encounter for induction of labor 07/06/2019  . Supervision of high risk pregnancy, antepartum 05/07/2019  . Gestational diabetes mellitus (GDM), antepartum 05/07/2019  . Language barrier affecting health care 05/07/2019    Past Surgical History:  Procedure Laterality Date  . NO PAST SURGERIES      OB History    Gravida  1   Para  1   Term  1   Preterm      AB      Living  1     SAB      TAB      Ectopic      Multiple  0   Live Births  1            Home Medications    Prior to Admission medications   Medication Sig Start Date End Date Taking? Authorizing Provider  Accu-Chek Softclix Lancets lancets Use as instructed 06/11/19   Sloan Leiter, MD  Blood Glucose Monitoring Suppl  (ACCU-CHEK GUIDE) w/Device KIT 1 Units by Does not apply route 3 (three) times daily. 05/28/19   Chancy Milroy, MD  Blood Pressure Monitoring DEVI 1 Device by Does not apply route once a week. 05/08/19   Woodroe Mode, MD  docusate sodium (COLACE) 100 MG capsule Take 1 capsule (100 mg total) by mouth 2 (two) times daily. Patient not taking: Reported on 08/04/2019 07/09/19   Tamala Julian, [place], CNM  doxycycline (VIBRAMYCIN) 100 MG capsule Take 1 capsule (100 mg total) by mouth 2 (two) times daily for 10 days. 01/21/20 01/31/20  Cierah Crader C, PA-C  ibuprofen (ADVIL) 600 MG tablet Take 1 tablet (600 mg total) by mouth every 6 (six) hours. Patient not taking: Reported on 08/04/2019 07/09/19   Tamala Julian, [place], CNM  norgestimate-ethinyl estradiol (ORTHO-CYCLEN) 0.25-35 MG-MCG tablet Take 1 tablet by mouth daily. 09/17/19   Rasch, Anderson Malta I, NP  oxyCODONE (ROXICODONE) 5 MG immediate release tablet Take 1 tablet (5 mg total) by mouth every 8 (eight) hours as needed for severe pain. Patient not taking: Reported on 08/04/2019 07/07/19 07/06/20  Gladys Damme, MD  Prenatal Vit-Fe Fumarate-FA (PRENATAL MULTIVITAMIN) TABS tablet Take 1 tablet by mouth daily at 12 noon.    [provider]    Family History  History reviewed. No pertinent family history.  Social History Social History   Tobacco Use  . Smoking status: Never Smoker  . Smokeless tobacco: Never Used  Vaping Use  . Vaping Use: Never used  Substance Use Topics  . Alcohol use: Not Currently  . Drug use: Never     Allergies   Patient has no known allergies.   Review of Systems Review of Systems  Constitutional: Negative for fatigue and fever.  Eyes: Negative for visual disturbance.  Respiratory: Negative for shortness of breath.   Cardiovascular: Negative for chest pain.  Gastrointestinal: Negative for abdominal pain, nausea and vomiting.  Musculoskeletal: Negative for arthralgias and joint swelling.  Skin: Positive for color  change and wound. Negative for rash.  Neurological: Negative for dizziness, weakness, light-headedness and headaches.     Physical Exam Triage Vital Signs ED Triage Vitals [01/21/20 1659]  Enc Vitals Group     BP 115/69     Pulse Rate 84     Resp 16     Temp 98.5 F (36.9 C)     Temp src      SpO2 99 %     Weight      Height      Head Circumference      Peak Flow      Pain Score 0     Pain Loc      Pain Edu?      Excl. in [place]?    No data found.  Updated Vital Signs BP 115/69   Pulse 84   Temp 98.5 F (36.9 C)   Resp 16   SpO2 99%   Visual Acuity Right Eye Distance:   Left Eye Distance:   Bilateral Distance:    Right Eye Near:   Left Eye Near:    Bilateral Near:     Physical Exam Vitals and nursing note reviewed.  Constitutional:      Appearance: She is well-developed.     Comments: No acute distress  HENT:     Head: Normocephalic and atraumatic.     Nose: Nose normal.  Eyes:     Conjunctiva/sclera: Conjunctivae normal.  Cardiovascular:     Rate and Rhythm: Normal rate.  Pulmonary:     Effort: Pulmonary effort is normal. No respiratory distress.  Abdominal:     General: There is no distension.     Comments: Abdomen with healing laparoscopic scars noted superior and inferior to umbilicus as well as left lower quadrant  Lesion to superior umbilicus with yellow crusting, crusting removed with alcohol pad and was able to express a slightly clear/slightly purulent and serosanguineous drainage.  No surrounding erythema to the wound, mild tenderness to palpation on left aspect of wound; abdomen otherwise nontender and soft  Musculoskeletal:        General: Normal range of motion.     Cervical back: Neck supple.  Skin:    General: Skin is warm and dry.  Neurological:     Mental Status: She is alert and oriented to person, place, and time.      UC Treatments / Results  Labs (all labs ordered are listed, but only abnormal results are displayed) Labs  Reviewed - No data to display  EKG   Radiology No results found.  Procedures Procedures (including critical care time)  Medications Ordered in UC Medications - No data to display  Initial Impression / Assessment and Plan / UC Course  I have reviewed the triage vital signs and the  nursing notes.  Pertinent labs & imaging results that were available during my care of the patient were reviewed by me and considered in my medical decision making (see chart for details).     Empirically placing on antibiotics to cover for infection with doxycycline, wound otherwise appears to be healing well.  Advised patient to continue to keep incisions clean and dry.  Patient to follow-up in emergency room if drainage persisting or worsening, developing increased pain or fevers for further evaluation of underlying fluid collection or abscess that may need to be drained.  Patient stable at this time without any systemic symptoms.  No significant abdominal tenderness indicating need for emergent imaging at this time.  Will trial outpatient therapy.  Discussed strict return precautions. Patient verbalized understanding and is agreeable with plan.  Final Clinical Impressions(s) / UC Diagnoses   Final diagnoses:  Wound dehiscence  Wound drainage     Discharge Instructions     Doxycycline twice daily for 10 days Keep wound clean and dry Follow up in emergency room if developing worsening symptoms, drainage, pain, fevers    ED Prescriptions    Medication Sig Dispense Auth. Provider   doxycycline (VIBRAMYCIN) 100 MG capsule Take 1 capsule (100 mg total) by mouth 2 (two) times daily for 10 days. 20 capsule Almer Bushey, [place] C, PA-C     PDMP not reviewed this encounter.   Janith Lima, [place] 01/21/20 1744

## 2020-01-21 NOTE — Discharge Instructions (Addendum)
Doxycycline twice daily for 10 days Keep wound clean and dry Follow up in emergency room if developing worsening symptoms, drainage, pain, fevers

## 2020-04-07 ENCOUNTER — Other Ambulatory Visit: Payer: Self-pay

## 2020-04-07 ENCOUNTER — Encounter: Payer: Self-pay | Admitting: Family Medicine

## 2020-04-07 ENCOUNTER — Ambulatory Visit: Payer: Medicaid Other | Attending: Family Medicine | Admitting: Family Medicine

## 2020-04-07 VITALS — BP 115/78 | HR 70 | Ht 62.0 in | Wt 113.8 lb

## 2020-04-07 DIAGNOSIS — H0289 Other specified disorders of eyelid: Secondary | ICD-10-CM

## 2020-04-07 DIAGNOSIS — Z1159 Encounter for screening for other viral diseases: Secondary | ICD-10-CM | POA: Diagnosis not present

## 2020-04-07 DIAGNOSIS — R1033 Periumbilical pain: Secondary | ICD-10-CM | POA: Diagnosis not present

## 2020-04-07 DIAGNOSIS — Z23 Encounter for immunization: Secondary | ICD-10-CM

## 2020-04-07 DIAGNOSIS — E119 Type 2 diabetes mellitus without complications: Secondary | ICD-10-CM | POA: Diagnosis not present

## 2020-04-07 LAB — POCT GLYCOSYLATED HEMOGLOBIN (HGB A1C): HbA1c, POC (controlled diabetic range): 8.2 % — AB (ref 0.0–7.0)

## 2020-04-07 MED ORDER — ERYTHROMYCIN 5 MG/GM OP OINT: 1.0000 "application " | TOPICAL_OINTMENT | Freq: Every day | OPHTHALMIC | 0 refills | Status: DC

## 2020-04-07 MED ORDER — METFORMIN HCL 500 MG PO TABS: ORAL_TABLET | ORAL | 3 refills | Status: DC

## 2020-04-07 NOTE — Patient Instructions (Signed)

## 2020-04-07 NOTE — Progress Notes (Signed)
Abdominal pain has no other symptoms.

## 2020-04-07 NOTE — Progress Notes (Signed)
Subjective:  Patient ID: Regina Hayes, female    DOB: May 23, 1996  Age: 24 y.o. MRN: [id number]  CC: New Patient (Initial Visit)   HPI Roslyn with a history of gestational DM, status post laparascopic appendectomy in 12/2019 who presents today to establish care. She has had intermittent abdominal pain which is periumbilical  And she is unable to rate the frequency but it is rated as mild, not associated with meals, resolves spontaneously and is not described as bothersome.  Denies presence of nausea, vomiting, diarrhea or constipation. Also complains of itching of her left upper eyelid for the last 2 months with no eye discharge, visual abnormalities. Endorses using fake eyelashes recently.  A1c performed in the clinic today returned at 8.2.  She is currently not on medications for diabetes. Past Medical History:  Diagnosis Date  . Diabetes mellitus without complication ([place])   . Gestational diabetes   . Medical history non-contributory   . S/P appendectomy 12/25/2019    Past Surgical History:  Procedure Laterality Date  . NO PAST SURGERIES      History reviewed. No pertinent family history.  No Known Allergies  Outpatient Medications Prior to Visit  Medication Sig Dispense Refill  . Accu-Chek Softclix Lancets lancets Use as instructed (Patient not taking: Reported on 04/07/2020) 100 each 2  . Blood Glucose Monitoring Suppl (ACCU-CHEK GUIDE) w/Device KIT 1 Units by Does not apply route 3 (three) times daily. (Patient not taking: Reported on 04/07/2020) 1 kit 0  . Blood Pressure Monitoring DEVI 1 Device by Does not apply route once a week. (Patient not taking: Reported on 04/07/2020) 1 Device 0  . docusate sodium (COLACE) 100 MG capsule Take 1 capsule (100 mg total) by mouth 2 (two) times daily. (Patient not taking: Reported on 08/04/2019) 10 capsule 0  . ibuprofen (ADVIL) 600 MG tablet Take 1 tablet (600 mg total) by mouth every 6 (six) hours. (Patient not taking: Reported  on 08/04/2019) 30 tablet 0  . norgestimate-ethinyl estradiol (ORTHO-CYCLEN) 0.25-35 MG-MCG tablet Take 1 tablet by mouth daily. (Patient not taking: Reported on 04/07/2020) 2 Package 0  . oxyCODONE (ROXICODONE) 5 MG immediate release tablet Take 1 tablet (5 mg total) by mouth every 8 (eight) hours as needed for severe pain. (Patient not taking: Reported on 08/04/2019) 20 tablet 0  . Prenatal Vit-Fe Fumarate-FA (PRENATAL MULTIVITAMIN) TABS tablet Take 1 tablet by mouth daily at 12 noon. (Patient not taking: Reported on 04/07/2020)     No facility-administered medications prior to visit.     ROS Review of Systems  Constitutional: Negative for activity change, appetite change and fatigue.  HENT: Negative for congestion, sinus pressure and sore throat.   Eyes: Negative for visual disturbance.  Respiratory: Negative for cough, chest tightness, shortness of breath and wheezing.   Cardiovascular: Negative for chest pain and palpitations.  Gastrointestinal: Positive for abdominal pain. Negative for abdominal distention and constipation.  Endocrine: Negative for polydipsia.  Genitourinary: Negative for dysuria and frequency.  Musculoskeletal: Negative for arthralgias and back pain.  Skin: Negative for rash.  Neurological: Negative for tremors, light-headedness and numbness.  Hematological: Does not bruise/bleed easily.  Psychiatric/Behavioral: Negative for agitation and behavioral problems.    Objective:  BP 115/78   Pulse 70   Ht 5' 2"  (1.575 m)   Wt 113 lb 12.8 oz (51.6 kg)   SpO2 99%   BMI 20.81 kg/m   BP/Weight 04/07/2020 01/21/2020 9/37/3428  Systolic BP 768 115 726  Diastolic BP 78 69 71  Wt. (Lbs) 113.8 - -  BMI 20.81 - -      Physical Exam Constitutional:      Appearance: She is well-developed.  Eyes:     General: Lids are normal. Vision grossly intact.        Right eye: No discharge.        Left eye: No discharge.  Neck:     Vascular: No JVD.  Cardiovascular:     Rate  and Rhythm: Normal rate.     Heart sounds: Normal heart sounds. No murmur heard.   Pulmonary:     Effort: Pulmonary effort is normal.     Breath sounds: Normal breath sounds. No wheezing or rales.  Chest:     Chest wall: No tenderness.  Abdominal:     General: Bowel sounds are normal. There is no distension.     Palpations: Abdomen is soft. There is no mass.     Tenderness: There is no abdominal tenderness.  Musculoskeletal:        General: Normal range of motion.     Right lower leg: No edema.     Left lower leg: No edema.  Neurological:     Mental Status: She is alert and oriented to person, place, and time.  Psychiatric:        Mood and Affect: Mood normal.     CMP Latest Ref Rng & Units 12/06/2018  Glucose 70 - 99 mg/dL 89  BUN 6 - 20 mg/dL <5(L)  Creatinine 0.44 - 1.00 mg/dL 0.41(L)  Sodium 135 - 145 mmol/L 134(L)  Potassium 3.5 - 5.1 mmol/L 3.3(L)  Chloride 98 - 111 mmol/L 103  CO2 22 - 32 mmol/L 22  Calcium 8.9 - 10.3 mg/dL 9.1  Total Protein 6.5 - 8.1 g/dL 7.4  Total Bilirubin 0.3 - 1.2 mg/dL 0.2(L)  Alkaline Phos 38 - 126 U/L 43  AST 15 - 41 U/L 17  ALT 0 - 44 U/L 14    Lipid Panel  No results found for: CHOL, TRIG, HDL, CHOLHDL, VLDL, LDLCALC, LDLDIRECT  CBC    Component Value Date/Time   WBC 9.6 07/09/2019 0508   RBC 3.27 (L) 07/09/2019 0508   HGB 9.5 (L) 07/09/2019 0508   HCT 29.0 (L) 07/09/2019 0508   PLT 138 (L) 07/09/2019 0508   MCV 88.7 07/09/2019 0508   MCH 29.1 07/09/2019 0508   MCHC 32.8 07/09/2019 0508   RDW 13.2 07/09/2019 0508    Lab Results  Component Value Date   HGBA1C 8.2 (A) 04/07/2020    Assessment & Plan:  1. Type 2 diabetes mellitus without complication, without long-term current use of insulin (HCC) Uncontrolled with A1c of 8.2; goal is less than 7.0 Previous history of gestational diabetes mellitus We will initiate Metformin Counseled on Diabetic diet, my plate method, 962 minutes of moderate intensity  exercise/week Blood sugar logs with fasting goals of 80-120 mg/dl, random of less than 180 and in the event of sugars less than 60 mg/dl or greater than 400 mg/dl encouraged to notify the clinic. Advised on the need for annual eye exams, annual foot exams, Pneumonia vaccine. - Basic Metabolic Panel - POCT glycosylated hemoglobin (Hb A1C) - metFORMIN (GLUCOPHAGE) 500 MG tablet; Take orally 1 tablet twice daily for 1 week then increase to 2 tablets twice daily thereafter.  Dispense: 120 tablet; Refill: 3  2. Pain of left eyelid Likely secondary to use of fake eyelashes - erythromycin ophthalmic ointment; Place 1 application into the left eye at  bedtime.  Dispense: 3.5 g; Refill: 0  3. Screening for viral disease - HCV RNA quant rflx ultra or genotyp([facility]) - HIV Antibody (routine testing w rflx)  4. Periumbilical abdominal pain History of laparoscopic appendectomy Advised that she might experience some intermittent pain This is not bothersome at this time and can be treated with OTC analgesics  5. Need for immunization against influenza - Flu Vaccine QUAD 36+ mos IM    Meds ordered this encounter  Medications  . erythromycin ophthalmic ointment    Sig: Place 1 application into the left eye at bedtime.    Dispense:  3.5 g    Refill:  0  . metFORMIN (GLUCOPHAGE) 500 MG tablet    Sig: Take orally 1 tablet twice daily for 1 week then increase to 2 tablets twice daily thereafter.    Dispense:  120 tablet    Refill:  3    Follow-up: Return in 3 months (on 07/08/2020), or if symptoms worsen or fail to improve, for Diabetes mellitus.       Charlott Rakes, MD, FAAFP. [facility] [place], [place]   04/07/2020, 9:58 AM

## 2020-04-08 ENCOUNTER — Telehealth: Payer: Self-pay

## 2020-04-08 NOTE — Telephone Encounter (Signed)
Patient name and DOB has been verified Patient was informed of lab results. Patient had no questions.  

## 2020-04-08 NOTE — Telephone Encounter (Signed)
-----   Message from Marcine Matar, MD sent at 04/08/2020  7:43 AM EDT ----- Kidney function is normal.  HIV test is negative.

## 2020-04-09 LAB — HCV RNA QUANT RFLX ULTRA OR GENOTYP: HCV Quant Baseline: NOT DETECTED IU/mL

## 2020-04-09 LAB — HIV ANTIBODY (ROUTINE TESTING W REFLEX): HIV Screen 4th Generation wRfx: NONREACTIVE

## 2020-04-09 LAB — BASIC METABOLIC PANEL
BUN/Creatinine Ratio: 12 (ref 9–23)
BUN: 7 mg/dL (ref 6–20)
CO2: 23 mmol/L (ref 20–29)
Calcium: 9.6 mg/dL (ref 8.7–10.2)
Chloride: 103 mmol/L (ref 96–106)
Creatinine, Ser: 0.59 mg/dL (ref 0.57–1.00)
GFR calc Af Amer: 146 mL/min/{1.73_m2} (ref >59)
GFR calc non Af Amer: 127 mL/min/{1.73_m2} (ref >59)
Glucose: 122 mg/dL — ABNORMAL HIGH (ref 65–99)
Potassium: 4.1 mmol/L (ref 3.5–5.2)
Sodium: 139 mmol/L (ref 134–144)

## 2020-04-11 ENCOUNTER — Telehealth: Payer: Self-pay

## 2020-04-11 NOTE — Telephone Encounter (Signed)
-----   Message from Marcine Matar, MD sent at 04/09/2020  4:05 PM EDT ----- Screen for hepatitis C is negative. Kidney function is nl.

## 2020-04-11 NOTE — Telephone Encounter (Signed)
Patient was called and a voicemail was left informing patient to return phone call for lab results. 

## 2020-04-19 ENCOUNTER — Encounter (HOSPITAL_COMMUNITY): Payer: Self-pay

## 2020-04-19 ENCOUNTER — Other Ambulatory Visit: Payer: Self-pay

## 2020-04-19 ENCOUNTER — Ambulatory Visit (HOSPITAL_COMMUNITY)
Admission: EM | Admit: 2020-04-19 | Discharge: 2020-04-19 | Disposition: A | Discharge status: Home / Self Care | Payer: Medicaid Other | Attending: Family Medicine | Admitting: Family Medicine

## 2020-04-19 DIAGNOSIS — Z3202 Encounter for pregnancy test, result negative: Secondary | ICD-10-CM

## 2020-04-19 DIAGNOSIS — R1110 Vomiting, unspecified: Secondary | ICD-10-CM | POA: Diagnosis not present

## 2020-04-19 DIAGNOSIS — Z20822 Contact with and (suspected) exposure to covid-19: Secondary | ICD-10-CM | POA: Insufficient documentation

## 2020-04-19 DIAGNOSIS — R112 Nausea with vomiting, unspecified: Secondary | ICD-10-CM | POA: Diagnosis present

## 2020-04-19 LAB — POCT URINALYSIS DIPSTICK, ED / UC
Bilirubin Urine: NEGATIVE
Glucose, UA: NEGATIVE mg/dL
Ketones, ur: NEGATIVE mg/dL
Leukocytes,Ua: NEGATIVE
Nitrite: NEGATIVE
Protein, ur: NEGATIVE mg/dL
Specific Gravity, Urine: 1.02 (ref 1.005–1.030)
Urobilinogen, UA: 0.2 mg/dL (ref 0.0–1.0)
pH: 5.5 (ref 5.0–8.0)

## 2020-04-19 LAB — POC URINE PREG, ED: Preg Test, Ur: NEGATIVE

## 2020-04-19 LAB — CBG MONITORING, ED: Glucose-Capillary: 92 mg/dL (ref 70–99)

## 2020-04-19 MED ORDER — ONDANSETRON HCL 4 MG PO TABS: 4.0000 mg | ORAL_TABLET | Freq: Four times a day (QID) | ORAL | 0 refills | Status: DC

## 2020-04-19 NOTE — Discharge Instructions (Signed)
Recommend checking your blood sugar at home when nauseous as if your blood sugar is too high this can cause you to experience nausea with vomiting. I have prescribed you Zofran for you to take to manage her symptoms of nausea.  If you develop any severe abdominal pain or vomiting becomes persistent go immediately to the emergency department.

## 2020-04-19 NOTE — ED Triage Notes (Signed)
Pt present vomiting, symptom started today. Pt denies any other symptoms.

## 2020-04-19 NOTE — ED Provider Notes (Signed)
[place]    CSN: [id number] Arrival date & time: 04/19/20  1722      History   Chief Complaint Chief Complaint  Patient presents with  . Emesis    HPI Regina Hayes is a 24 y.o. female.   Interpreter service used  HPI Patient presents today for evaluation of nausea with emesis times today.  Patient has history of uncontrolled diabetes most recent A1c was 8.2. Reports nausea x 1 day and subsequent vomitus today x 2 episodes. Denies any abdominal pain. She has not checked her blood sugar. Denies headache, cough, shortness of breath, dizziness or weakness. Past Medical History:  Diagnosis Date  . Diabetes mellitus without complication ([place])   . Gestational diabetes   . Medical history non-contributory   . S/P appendectomy 12/25/2019    Patient Active Problem List   Diagnosis Date Noted  . Abnormal vaginal bleeding 09/17/2019  . Abnormal maternal glucose tolerance, postpartum condition 09/10/2019  . Elevated glucose tolerance test 08/12/2019  . Nexplanon in place 07/08/2019  . Encounter for induction of labor 07/06/2019  . Supervision of high risk pregnancy, antepartum 05/07/2019  . Gestational diabetes mellitus (GDM), antepartum 05/07/2019  . Language barrier affecting health care 05/07/2019    Past Surgical History:  Procedure Laterality Date  . NO PAST SURGERIES      OB History    Gravida  1   Para  1   Term  1   Preterm      AB      Living  1     SAB      TAB      Ectopic      Multiple  0   Live Births  1            Home Medications    Prior to Admission medications   Medication Sig Start Date End Date Taking? Authorizing Provider  Accu-Chek Softclix Lancets lancets Use as instructed Patient not taking: Reported on 04/07/2020 06/11/19   Sloan Leiter, MD  Blood Glucose Monitoring Suppl (ACCU-CHEK GUIDE) w/Device KIT 1 Units by Does not apply route 3 (three) times daily. Patient not taking: Reported on 04/07/2020  05/28/19   Chancy Milroy, MD  Blood Pressure Monitoring DEVI 1 Device by Does not apply route once a week. Patient not taking: Reported on 04/07/2020 05/08/19   Woodroe Mode, MD  docusate sodium (COLACE) 100 MG capsule Take 1 capsule (100 mg total) by mouth 2 (two) times daily. Patient not taking: Reported on 08/04/2019 07/09/19   Tamala Julian [place], CNM  erythromycin ophthalmic ointment Place 1 application into the left eye at bedtime. 04/07/20   Charlott Rakes, MD  ibuprofen (ADVIL) 600 MG tablet Take 1 tablet (600 mg total) by mouth every 6 (six) hours. Patient not taking: Reported on 08/04/2019 07/09/19   Tamala Julian, [place], CNM  metFORMIN (GLUCOPHAGE) 500 MG tablet Take orally 1 tablet twice daily for 1 week then increase to 2 tablets twice daily thereafter. 04/07/20   Charlott Rakes, MD  norgestimate-ethinyl estradiol (ORTHO-CYCLEN) 0.25-35 MG-MCG tablet Take 1 tablet by mouth daily. Patient not taking: Reported on 04/07/2020 09/17/19   Rasch, Anderson [place] I, NP  oxyCODONE (ROXICODONE) 5 MG immediate release tablet Take 1 tablet (5 mg total) by mouth every 8 (eight) hours as needed for severe pain. Patient not taking: Reported on 08/04/2019 07/07/19 07/06/20  Gladys Damme, MD  Prenatal Vit-Fe Fumarate-FA (PRENATAL MULTIVITAMIN) TABS tablet Take 1 tablet by mouth daily at 12 noon. Patient  not taking: Reported on 04/07/2020    [provider]    Family History History reviewed. No pertinent family history.  Social History Social History   Tobacco Use  . Smoking status: Never Smoker  . Smokeless tobacco: Never Used  Vaping Use  . Vaping Use: Never used  Substance Use Topics  . Alcohol use: Not Currently  . Drug use: Never     Allergies   Patient has no known allergies.   Review of Systems Review of Systems   Physical Exam Triage Vital Signs ED Triage Vitals  Enc Vitals Group     BP 04/19/20 1923 111/77     Pulse Rate 04/19/20 1923 68     Resp 04/19/20 1923 16      Temp 04/19/20 1923 97.7 F (36.5 C)     Temp Source 04/19/20 1923 Oral     SpO2 04/19/20 1923 100 %     Weight --      Height --      Head Circumference --      Peak Flow --      Pain Score 04/19/20 1924 0     Pain Loc --      Pain Edu? --      Excl. in [place]? --    No data found.  Updated Vital Signs BP 111/77 (BP Location: Right Arm)   Pulse 68   Temp 97.7 F (36.5 C) (Oral)   Resp 16   SpO2 100%   Visual Acuity Right Eye Distance:   Left Eye Distance:   Bilateral Distance:    Right Eye Near:   Left Eye Near:    Bilateral Near:     Physical Exam General appearance: alert, well developed, well nourished, cooperative and in no distress Head: Normocephalic, without obvious abnormality, atraumatic Respiratory: Respirations even and unlabored, normal respiratory rate Heart: rate and rhythm normal. No gallop or murmurs noted on exam  Abdomen: BS +, no distention, no rebound tenderness, or no mass Extremities: No gross deformities Skin: Skin color, texture, turgor normal. No rashes seen  Psych: Appropriate mood and affect. Neurologic: Mental status: Alert, oriented to person, place, and time, thought content appropriate.   UC Treatments / Results  Labs (all labs ordered are listed, but only abnormal results are displayed) Labs Reviewed - No data to display  EKG  Radiology No results found.  Procedures Procedures (including critical care time)  Medications Ordered in UC Medications - No data to display  Initial Impression / Assessment and Plan / UC Course  I have reviewed the triage vital signs and the nursing notes.  Pertinent labs & imaging results that were available during my care of the patient were reviewed by me and considered in my medical decision making (see chart for details).    UA normal (RBC related menses). Blood sugar normal. HCG negative. Respiratory panel pending to rule out COVID-19 or Influenza. Treating nausea with Zofran. Strict  follow-up precautions given to follow-up at the ER if abdominal pain develops. Patient verbalized understanding and agreement with plan. Final Clinical Impressions(s) / UC Diagnoses   Final diagnoses:  Nausea and vomiting, intractability of vomiting not specified, unspecified vomiting type  Encounter for laboratory testing for COVID-19 virus     Discharge Instructions     Recommend checking your blood sugar at home when nauseous as if your blood sugar is too high this can cause you to experience nausea with vomiting. I have prescribed you Zofran for you to take  to manage her symptoms of nausea.  If you develop any severe abdominal pain or vomiting becomes persistent go immediately to the emergency department.    ED Prescriptions    Medication Sig Dispense Auth. Provider   ondansetron (ZOFRAN) 4 MG tablet Take 1 tablet (4 mg total) by mouth every 6 (six) hours. 12 tablet Scot Jun, FNP     PDMP not reviewed this encounter.   Scot Jun, FNP 04/21/20 [phone]

## 2020-04-20 LAB — RESP PANEL BY RT PCR (RSV, FLU A&B, COVID)
Influenza A by PCR: NEGATIVE
Influenza B by PCR: NEGATIVE
Respiratory Syncytial Virus by PCR: NEGATIVE
SARS Coronavirus 2 by RT PCR: NEGATIVE

## 2020-06-28 ENCOUNTER — Other Ambulatory Visit: Payer: Self-pay

## 2020-06-28 ENCOUNTER — Ambulatory Visit (HOSPITAL_COMMUNITY)
Admission: EM | Admit: 2020-06-28 | Discharge: 2020-06-28 | Disposition: A | Discharge status: Home / Self Care | Payer: Medicaid Other | Attending: Urgent Care | Admitting: Urgent Care

## 2020-06-28 ENCOUNTER — Encounter (HOSPITAL_COMMUNITY): Payer: Self-pay

## 2020-06-28 DIAGNOSIS — Z7984 Long term (current) use of oral hypoglycemic drugs: Secondary | ICD-10-CM | POA: Insufficient documentation

## 2020-06-28 DIAGNOSIS — U071 COVID-19: Secondary | ICD-10-CM | POA: Diagnosis not present

## 2020-06-28 DIAGNOSIS — B349 Viral infection, unspecified: Secondary | ICD-10-CM

## 2020-06-28 DIAGNOSIS — R52 Pain, unspecified: Secondary | ICD-10-CM

## 2020-06-28 DIAGNOSIS — J029 Acute pharyngitis, unspecified: Secondary | ICD-10-CM | POA: Diagnosis present

## 2020-06-28 LAB — RESP PANEL BY RT-PCR (FLU A&B, COVID) ARPGX2
Influenza A by PCR: NEGATIVE
Influenza B by PCR: NEGATIVE
SARS Coronavirus 2 by RT PCR: POSITIVE — AB

## 2020-06-28 MED ORDER — CETIRIZINE HCL 10 MG PO TABS: 10.0000 mg | ORAL_TABLET | Freq: Every day | ORAL | 0 refills | Status: DC

## 2020-06-28 MED ORDER — PSEUDOEPHEDRINE HCL 30 MG PO TABS: 30.0000 mg | ORAL_TABLET | Freq: Three times a day (TID) | ORAL | 0 refills | Status: DC | PRN

## 2020-06-28 MED ORDER — PROMETHAZINE-DM 6.25-15 MG/5ML PO SYRP: 5.0000 mL | ORAL_SOLUTION | Freq: Every evening | ORAL | 0 refills | Status: DC | PRN

## 2020-06-28 MED ORDER — BENZONATATE 100 MG PO CAPS
100.0000 mg | ORAL_CAPSULE | Freq: Three times a day (TID) | ORAL | 0 refills | Status: DC | PRN
Start: 2020-06-28 — End: 2021-12-20

## 2020-06-28 NOTE — Discharge Instructions (Addendum)

## 2020-06-28 NOTE — ED Provider Notes (Signed)
[place]   MRN: [id number] DOB: April 20, 1996  Subjective:   Regina Hayes is a 25 y.o. female presenting for 2-day history of acute onset malaise, throat pain, body aches, coughing.  Patient is both vaccinated against flu and COVID-19.  Denies chest pain, shortness of breath.  Denies history of asthma.  Patient is non-smoker.  She is not currently pregnant, not breast-feeding.  Does not work in [employer].  No current facility-administered medications for this encounter.  Current Outpatient Medications:  .  Accu-Chek Softclix Lancets lancets, Use as instructed (Patient not taking: Reported on 04/07/2020), Disp: 100 each, Rfl: 2 .  Blood Glucose Monitoring Suppl (ACCU-CHEK GUIDE) w/Device KIT, 1 Units by Does not apply route 3 (three) times daily. (Patient not taking: Reported on 04/07/2020), Disp: 1 kit, Rfl: 0 .  Blood Pressure Monitoring DEVI, 1 Device by Does not apply route once a week. (Patient not taking: Reported on 04/07/2020), Disp: 1 Device, Rfl: 0 .  docusate sodium (COLACE) 100 MG capsule, Take 1 capsule (100 mg total) by mouth 2 (two) times daily. (Patient not taking: Reported on 08/04/2019), Disp: 10 capsule, Rfl: 0 .  erythromycin ophthalmic ointment, Place 1 application into the left eye at bedtime., Disp: 3.5 g, Rfl: 0 .  ibuprofen (ADVIL) 600 MG tablet, Take 1 tablet (600 mg total) by mouth every 6 (six) hours. (Patient not taking: Reported on 08/04/2019), Disp: 30 tablet, Rfl: 0 .  metFORMIN (GLUCOPHAGE) 500 MG tablet, Take orally 1 tablet twice daily for 1 week then increase to 2 tablets twice daily thereafter., Disp: 120 tablet, Rfl: 3 .  norgestimate-ethinyl estradiol (ORTHO-CYCLEN) 0.25-35 MG-MCG tablet, Take 1 tablet by mouth daily. (Patient not taking: Reported on 04/07/2020), Disp: 2 Package, Rfl: 0 .  ondansetron (ZOFRAN) 4 MG tablet, Take 1 tablet (4 mg total) by mouth every 6 (six) hours., Disp: 12 tablet, Rfl: 0 .  oxyCODONE (ROXICODONE) 5 MG  immediate release tablet, Take 1 tablet (5 mg total) by mouth every 8 (eight) hours as needed for severe pain. (Patient not taking: Reported on 08/04/2019), Disp: 20 tablet, Rfl: 0 .  Prenatal Vit-Fe Fumarate-FA (PRENATAL MULTIVITAMIN) TABS tablet, Take 1 tablet by mouth daily at 12 noon. (Patient not taking: Reported on 04/07/2020), Disp: , Rfl:    No Known Allergies  Past Medical History:  Diagnosis Date  . Diabetes mellitus without complication ([place])   . Gestational diabetes   . Medical history non-contributory   . S/P appendectomy 12/25/2019     Past Surgical History:  Procedure Laterality Date  . NO PAST SURGERIES      Family History  Family history unknown: Yes    Social History   Tobacco Use  . Smoking status: Never Smoker  . Smokeless tobacco: Never Used  Vaping Use  . Vaping Use: Never used  Substance Use Topics  . Alcohol use: Not Currently  . Drug use: Never    ROS   Objective:   Vitals: BP 113/85 (BP Location: Right Arm)   Pulse 84   Temp 99.3 F (37.4 C) (Oral)   Resp 17   SpO2 100%   Physical Exam Constitutional:      General: She is not in acute distress.    Appearance: Normal appearance. She is well-developed. She is not ill-appearing, toxic-appearing or diaphoretic.  HENT:     Head: Normocephalic and atraumatic.     Nose: Nose normal.     Mouth/Throat:     Mouth: Mucous membranes are moist.  Pharynx: No oropharyngeal exudate or posterior oropharyngeal erythema.  Eyes:     General: No scleral icterus.       Right eye: No discharge.        Left eye: No discharge.     Extraocular Movements: Extraocular movements intact.     Conjunctiva/sclera: Conjunctivae normal.     Pupils: Pupils are equal, round, and reactive to light.  Cardiovascular:     Rate and Rhythm: Normal rate and regular rhythm.     Pulses: Normal pulses.     Heart sounds: Normal heart sounds. No murmur heard. No friction rub. No gallop.   Pulmonary:     Effort:  Pulmonary effort is normal. No respiratory distress.     Breath sounds: Normal breath sounds. No stridor. No wheezing, rhonchi or rales.  Skin:    General: Skin is warm and dry.     Findings: No rash.  Neurological:     Mental Status: She is alert and oriented to person, place, and time.  Psychiatric:        Mood and Affect: Mood normal.        Behavior: Behavior normal.        Thought Content: Thought content normal.        Judgment: Judgment normal.     Assessment and Plan :   PDMP not reviewed this encounter.  1. Viral syndrome   2. Sore throat   3. Body aches     Patient declined strep test. Will manage for viral illness such as viral URI, viral syndrome, viral rhinitis, COVID-19. Counseled patient on nature of COVID-19 including modes of transmission, diagnostic testing, management and supportive care.  Offered scripts for symptomatic relief. COVID 19 testing is pending. Counseled patient on potential for adverse effects with medications prescribed/recommended today, ER and return-to-clinic precautions discussed, patient verbalized understanding.     Jaynee Eagles, [place] 06/28/20 1635

## 2020-06-28 NOTE — ED Triage Notes (Signed)
Pt presents with sore throat X 2 days. 

## 2020-07-07 ENCOUNTER — Other Ambulatory Visit: Payer: Self-pay

## 2020-07-07 ENCOUNTER — Ambulatory Visit: Payer: Medicaid Other | Attending: Family Medicine | Admitting: Family Medicine

## 2020-07-07 DIAGNOSIS — E119 Type 2 diabetes mellitus without complications: Secondary | ICD-10-CM | POA: Diagnosis not present

## 2020-07-07 MED ORDER — GLIPIZIDE 5 MG PO TABS: 2.5000 mg | ORAL_TABLET | Freq: Every day | ORAL | 6 refills | Status: DC

## 2020-07-07 NOTE — Progress Notes (Signed)
Has not taken Metformin in 1 week due to bloating.

## 2020-07-07 NOTE — Progress Notes (Signed)
Virtual Visit via Telephone Note  I connected with Regina Hayes, on 07/07/2020 at 10:13 AM by telephone due to the COVID-19 pandemic and verified that I am speaking with the correct person using two identifiers.   Consent: I discussed the limitations, risks, security and privacy concerns of performing an evaluation and management service by telephone and the availability of in person appointments. I also discussed with the patient that there may be a patient responsible charge related to this service. The patient expressed understanding and agreed to proceed.   Location of Patient: Home  Location of Provider: Home   Persons participating in Telemedicine visit: Willo Gaul Ozzie Hoyle Dr. Margarita Rana     History of Present Illness: 25 year old female with a history of Type 2 Diabetes Mellitus ( A1c 8.2) who presents for a follow up visit. She complains of bloating on Metformin but no hypoglycemic episodes or Neuropathy. She was diagnosed with COVID on 06/28/20 and states she has no symptoms at the moment and feels fine. Denies additional concerns today.  Past Medical History:  Diagnosis Date  . Diabetes mellitus without complication ([place])   . Gestational diabetes   . Medical history non-contributory   . S/P appendectomy 12/25/2019   No Known Allergies  Current Outpatient Medications on File Prior to Visit  Medication Sig Dispense Refill  . metFORMIN (GLUCOPHAGE) 500 MG tablet Take orally 1 tablet twice daily for 1 week then increase to 2 tablets twice daily thereafter. 120 tablet 3  . ondansetron (ZOFRAN) 4 MG tablet Take 1 tablet (4 mg total) by mouth every 6 (six) hours. 12 tablet 0  . Accu-Chek Softclix Lancets lancets Use as instructed (Patient not taking: No sig reported) 100 each 2  . benzonatate (TESSALON) 100 MG capsule Take 1-2 capsules (100-200 mg total) by mouth 3 (three) times daily as needed for cough. (Patient not taking: Reported on 07/07/2020) 60 capsule  0  . Blood Glucose Monitoring Suppl (ACCU-CHEK GUIDE) w/Device KIT 1 Units by Does not apply route 3 (three) times daily. (Patient not taking: No sig reported) 1 kit 0  . Blood Pressure Monitoring DEVI 1 Device by Does not apply route once a week. (Patient not taking: No sig reported) 1 Device 0  . cetirizine (ZYRTEC ALLERGY) 10 MG tablet Take 1 tablet (10 mg total) by mouth daily. (Patient not taking: Reported on 07/07/2020) 30 tablet 0  . docusate sodium (COLACE) 100 MG capsule Take 1 capsule (100 mg total) by mouth 2 (two) times daily. (Patient not taking: No sig reported) 10 capsule 0  . erythromycin ophthalmic ointment Place 1 application into the left eye at bedtime. (Patient not taking: Reported on 07/07/2020) 3.5 g 0  . ibuprofen (ADVIL) 600 MG tablet Take 1 tablet (600 mg total) by mouth every 6 (six) hours. (Patient not taking: No sig reported) 30 tablet 0  . norgestimate-ethinyl estradiol (ORTHO-CYCLEN) 0.25-35 MG-MCG tablet Take 1 tablet by mouth daily. (Patient not taking: No sig reported) 2 Package 0  . Prenatal Vit-Fe Fumarate-FA (PRENATAL MULTIVITAMIN) TABS tablet Take 1 tablet by mouth daily at 12 noon. (Patient not taking: No sig reported)    . promethazine-dextromethorphan (PROMETHAZINE-DM) 6.25-15 MG/5ML syrup Take 5 mLs by mouth at bedtime as needed for cough. (Patient not taking: Reported on 07/07/2020) 100 mL 0  . pseudoephedrine (SUDAFED) 30 MG tablet Take 1 tablet (30 mg total) by mouth every 8 (eight) hours as needed for congestion. (Patient not taking: Reported on 07/07/2020) 30 tablet 0  No current facility-administered medications on file prior to visit.    Observations/Objective: Awake, alert, oriented x3 Not in acute distress   Lab Results  Component Value Date   HGBA1C 8.2 (A) 04/07/2020    Assessment and Plan: 1. Type 2 diabetes mellitus without complication, without long-term current use of insulin (HCC) Uncontrolled with A1c of 8.2 Discontinue Metformin due  to GI intolerance Placed on Glipizide Counseled on Diabetic diet, my plate method, 623 minutes of moderate intensity exercise/week Blood sugar logs with fasting goals of 80-120 mg/dl, random of less than 180 and in the event of sugars less than 60 mg/dl or greater than 400 mg/dl encouraged to notify the clinic. Advised on the need for annual eye exams, annual foot exams, Pneumonia vaccine. - glipiZIDE (GLUCOTROL) 5 MG tablet; Take 0.5 tablets (2.5 mg total) by mouth daily before breakfast.  Dispense: 30 tablet; Refill: 6   Follow Up Instructions: 3 months   I discussed the assessment and treatment plan with the patient. The patient was provided an opportunity to ask questions and all were answered. The patient agreed with the plan and demonstrated an understanding of the instructions.   The patient was advised to call back or seek an in-person evaluation if the symptoms worsen or if the condition fails to improve as anticipated.     I provided 12 minutes total of non-face-to-face time during this encounter including median intraservice time, reviewing previous notes, investigations, ordering medications, medical decision making, coordinating care and patient verbalized understanding at the end of the visit.     Charlott Rakes, MD, FAAFP. [facility], [place]   07/07/2020, 10:13 AM

## 2020-08-04 ENCOUNTER — Telehealth: Payer: Self-pay | Admitting: Family Medicine

## 2020-08-04 NOTE — Telephone Encounter (Signed)
Copied from CRM [id number]. Topic: General - Call Back - No Documentation >> Aug 03, 2020  4:41 PM Randol Kern wrote: Best contact [phone] pt's husband called requesting to speak to the nurse regarding medication questions, please advise

## 2020-09-26 IMAGING — US OBSTETRIC <14 WK ULTRASOUND
1 series · 15 of 28 positions shown · non-contrast
Comparison: None.

CLINICAL DATA: Vaginal bleeding. Pregnant patient. Patient is 10
weeks pregnant based on her last menstrual period.

EXAM:
OBSTETRIC <14 WK ULTRASOUND
TECHNIQUE: Transabdominal ultrasound was performed for evaluation of the
gestation as well as the maternal uterus and adnexal regions.

[Series 1: obstetric <14 wk ultrasound · 43 acquisitions, 15 frames shown]
[im 1/43]
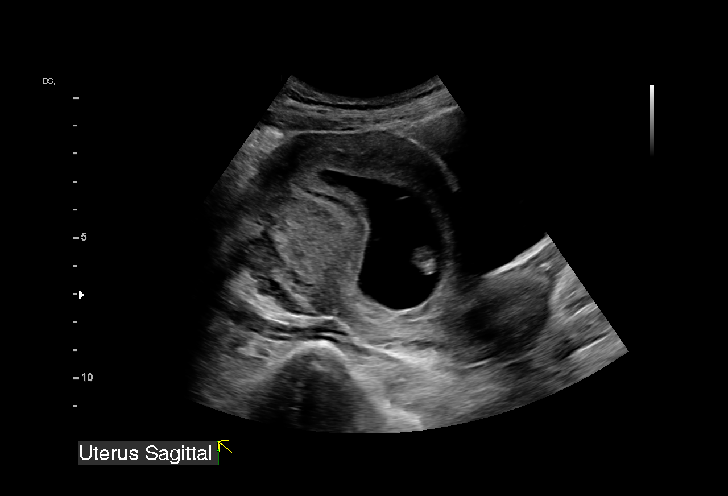
[im 4/43]
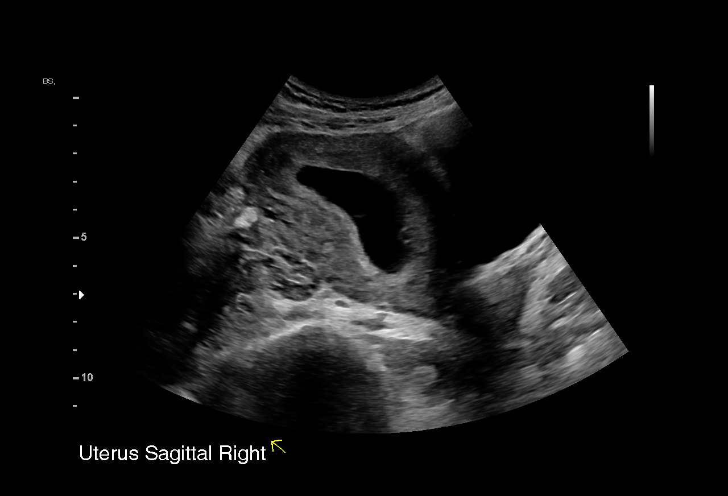
[im 7/43]
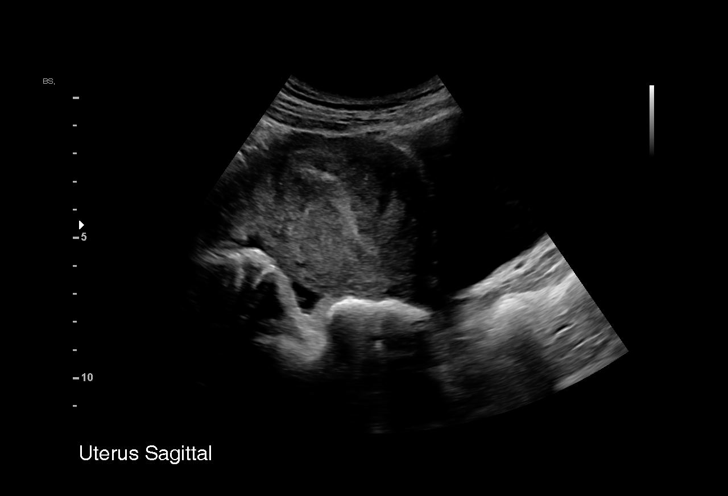
[im 10/43]
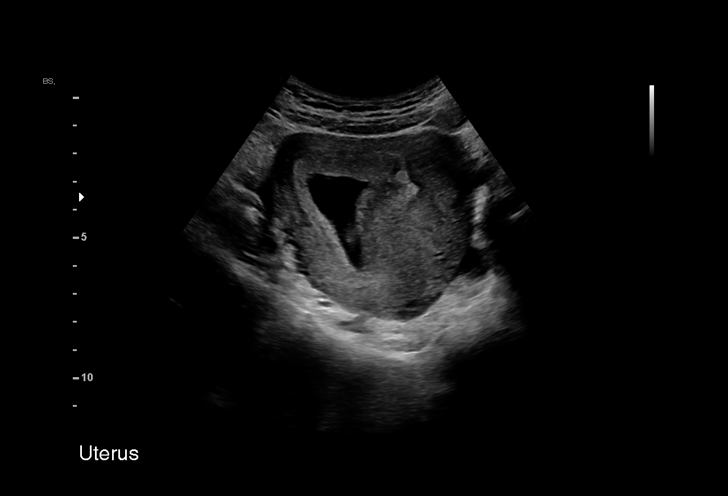
[im 13/43]
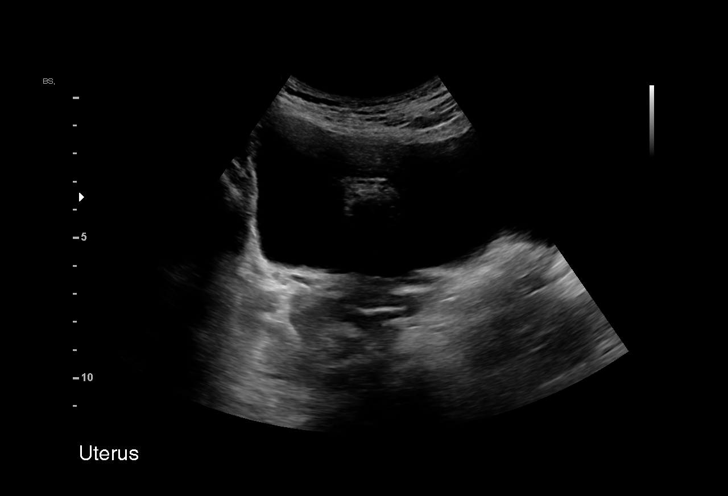
[im 16/43]
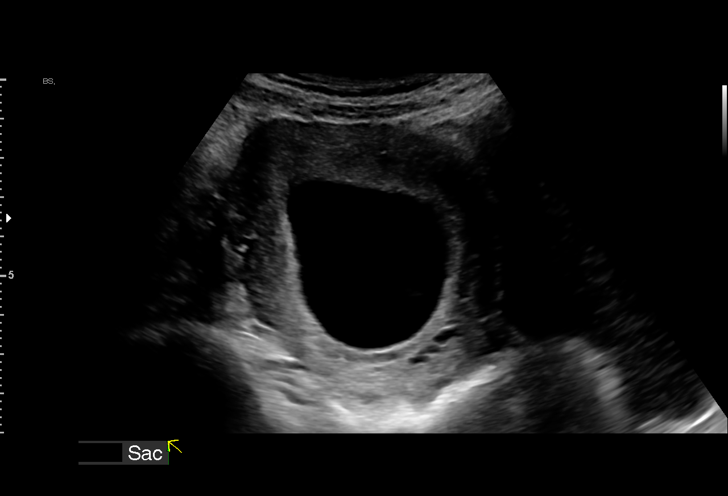
[im 19/43]
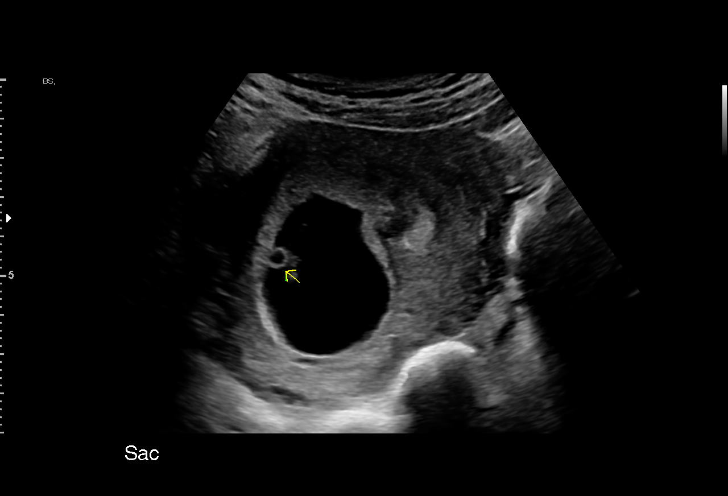
[im 22/43]
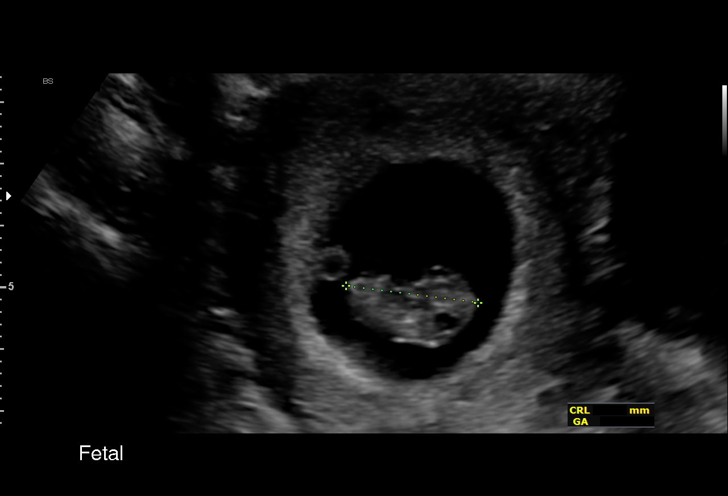
[im 24/43]
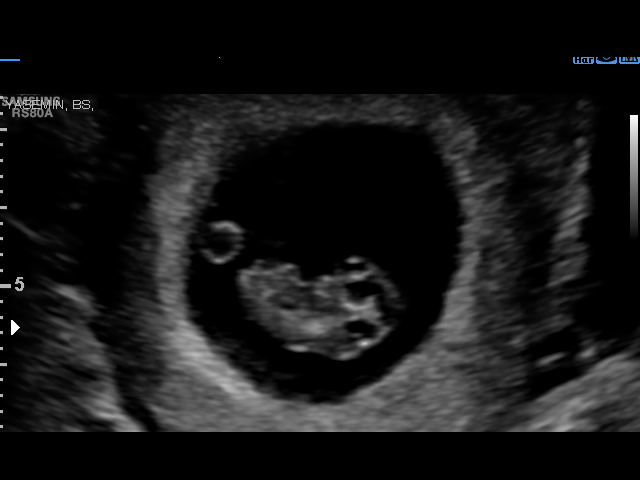
[im 27/43]
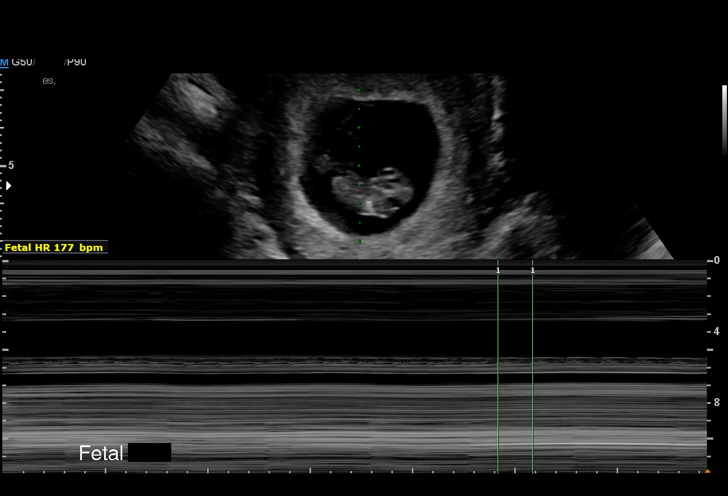
[im 30/43]
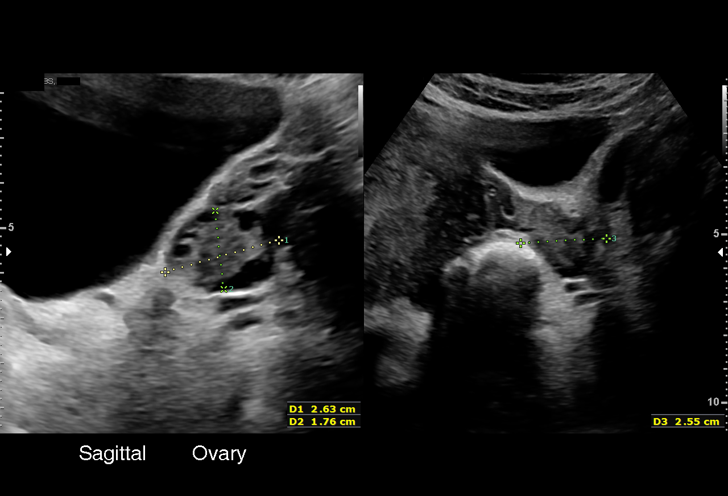
[im 33/43]
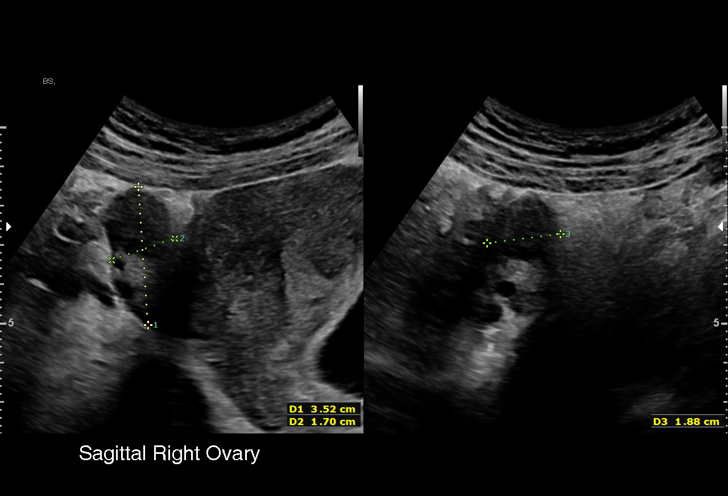
[im 36/43]
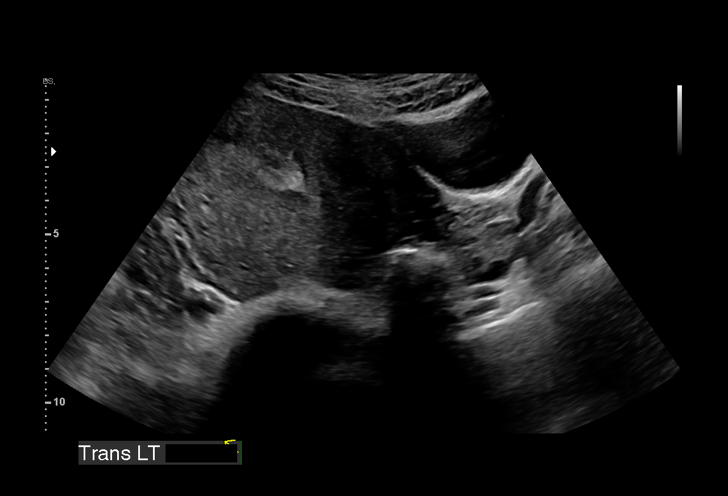
[im 39/43]
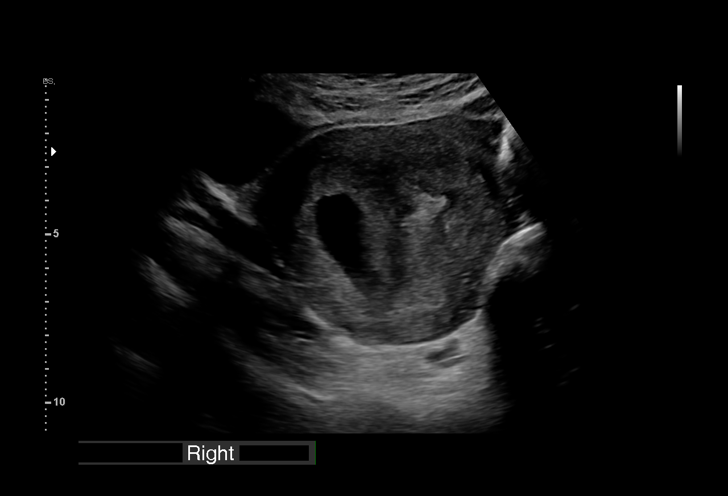
[im 43/43]
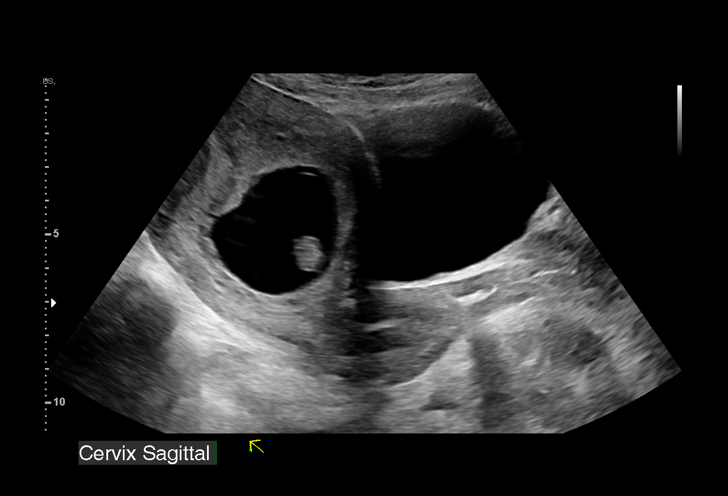

[15 of 28 positions shown; findings below may reference images not displayed]

FINDINGS: Intrauterine gestational sac: Single

Yolk sac:  Visualized.

Embryo:  Visualized.

Cardiac Activity: Visualized.

Heart Rate: 173 bpm

CRL:   21.73 mm   8 w 5 d                  US EDC: 07/13/2019

Subchorionic hemorrhage:  None visualized.

Maternal uterus/adnexae: No uterine masses. Cervix is unremarkable.
Normal ovaries. No adnexal masses and no pelvic free fluid.
IMPRESSION: 1. Single live intrauterine pregnancy with a measured gestational
age of 8 weeks and 5 days. No pregnancy complication. Normal ovaries
and adnexa.

## 2021-03-29 IMAGING — US US MFM OB DETAIL+14 WK
1 series · 13 of 28 positions shown · non-contrast
Comparison: none

[Series 1: us mfm ob detail+14 wk · 80 acquisitions, 13 frames shown]
[im 3/80]
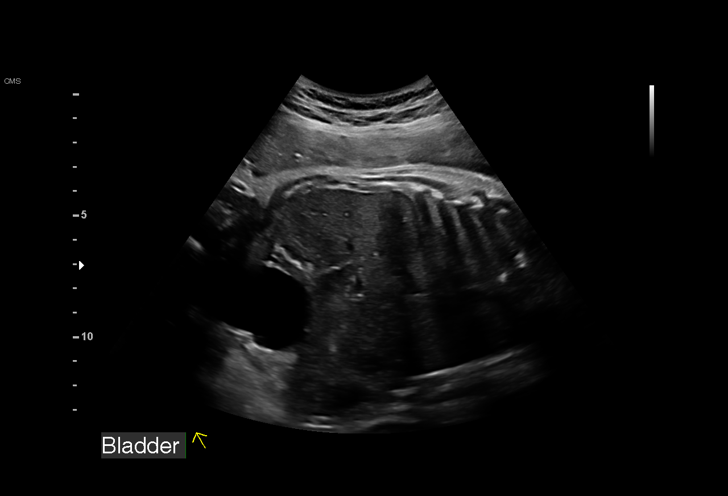
[im 9/80]
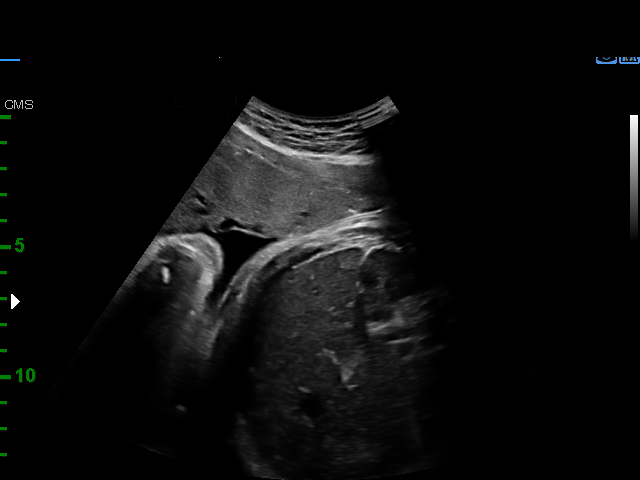
[im 15/80]
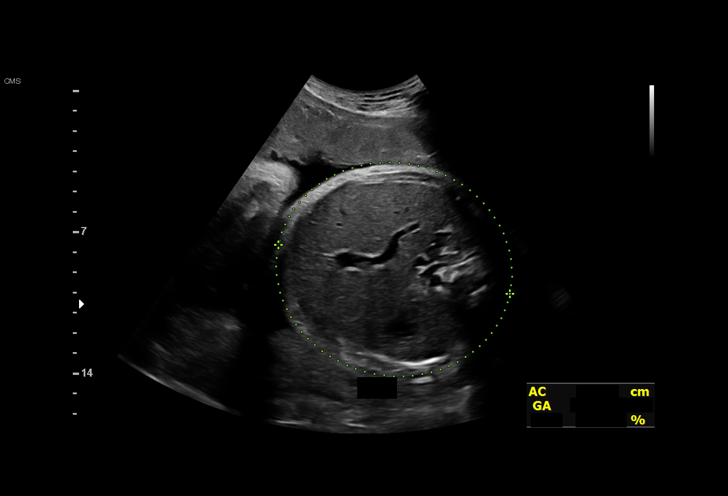
[im 21/80]
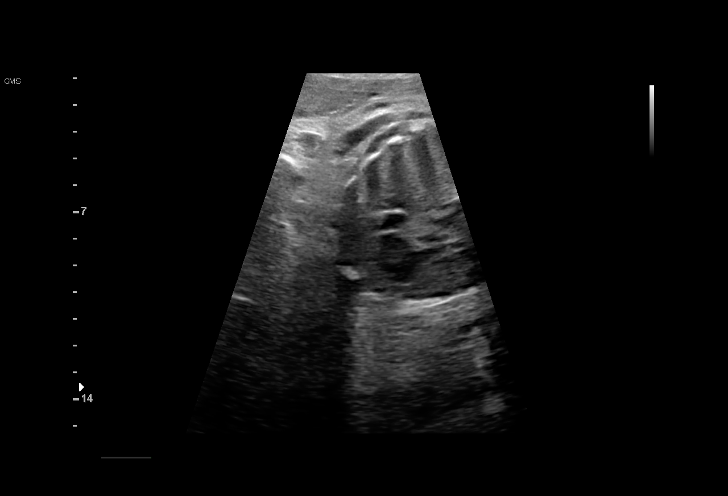
[im 27/80]
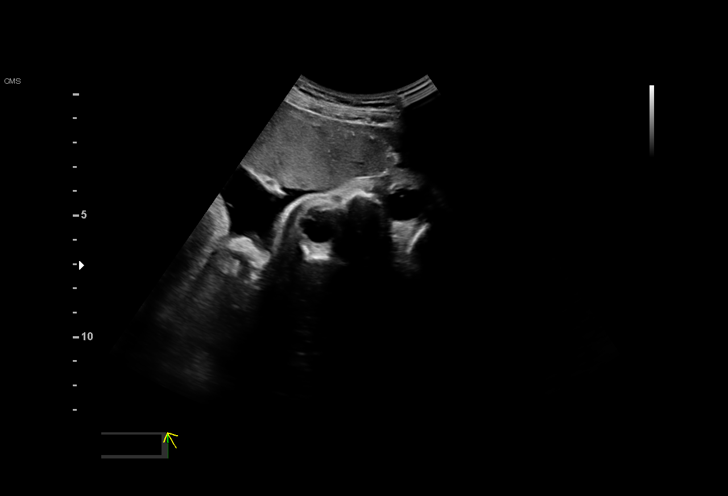
[im 33/80]
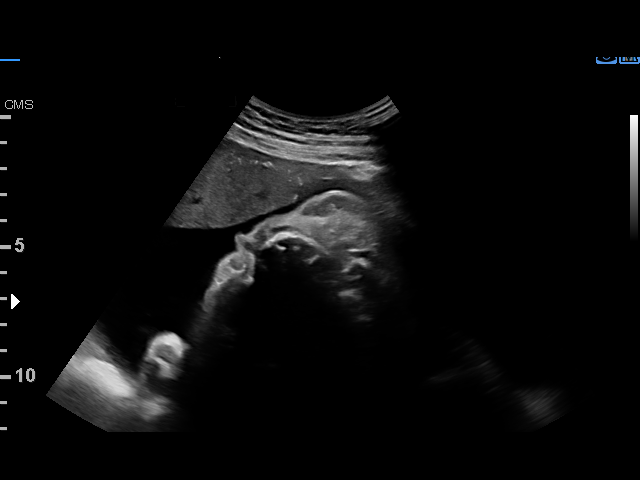
[im 41/80]
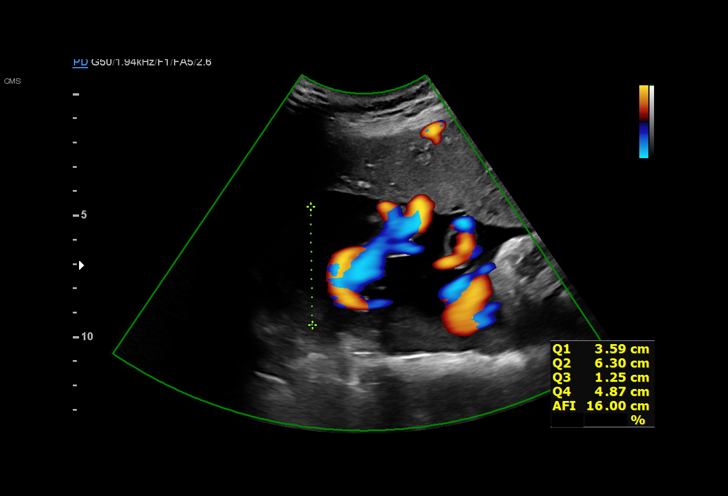
[im 47/80]
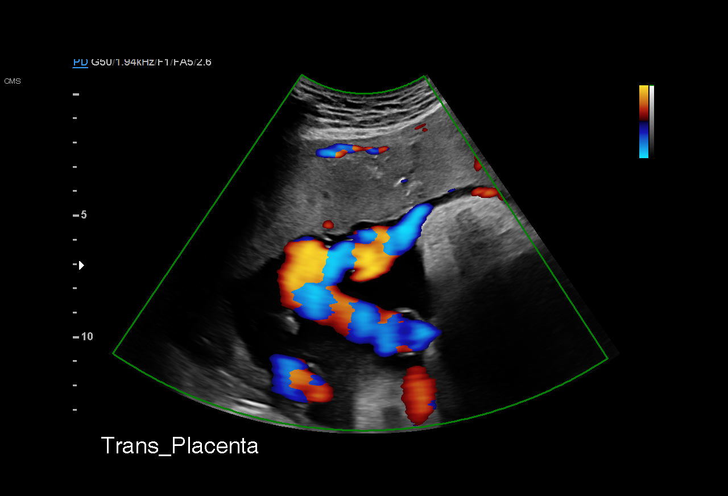
[im 53/80]
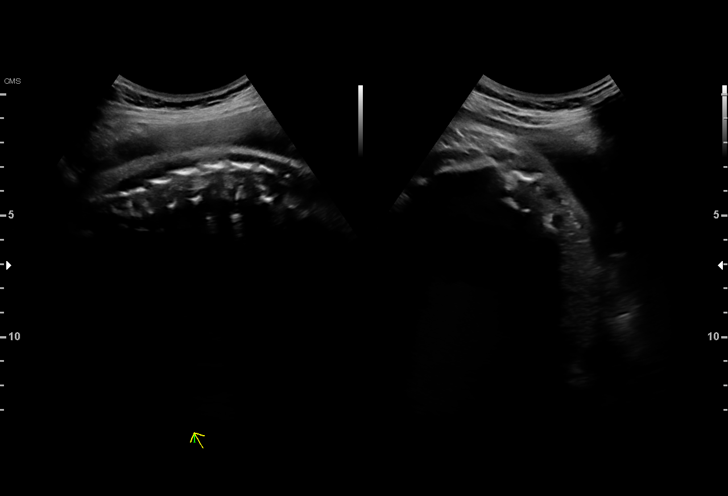
[im 59/80]
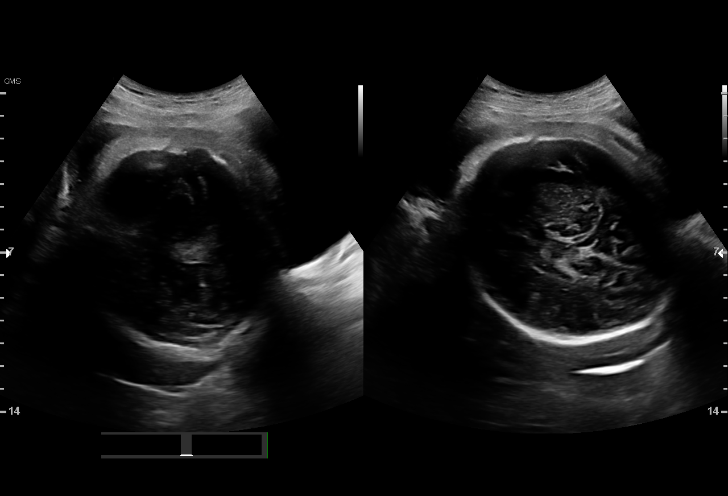
[im 65/80]
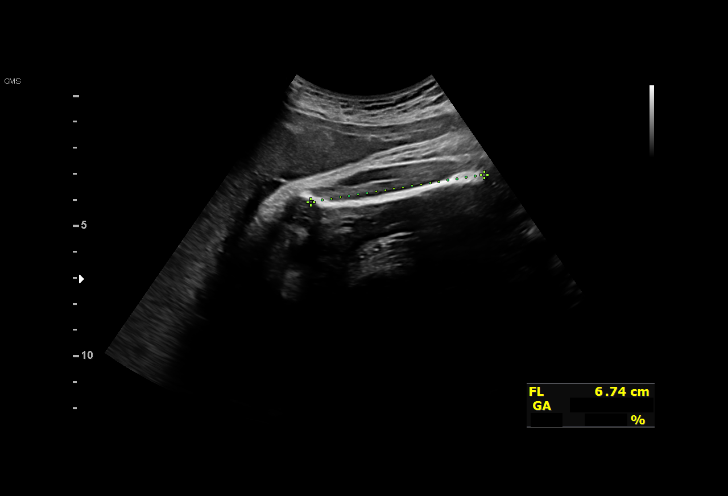
[im 71/80]
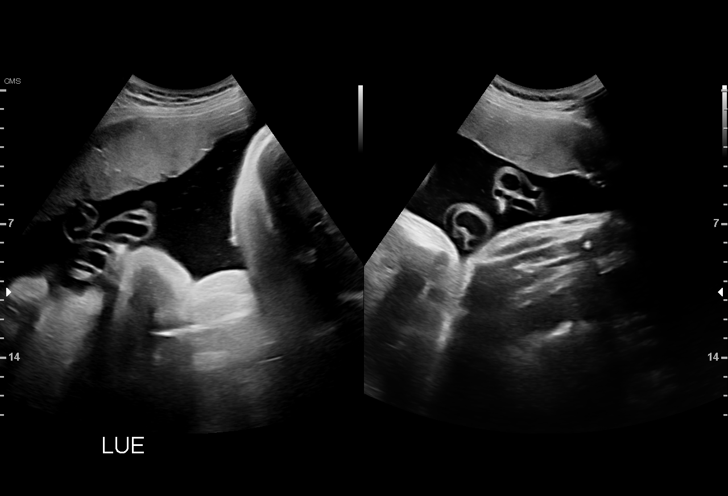
[im 77/80]
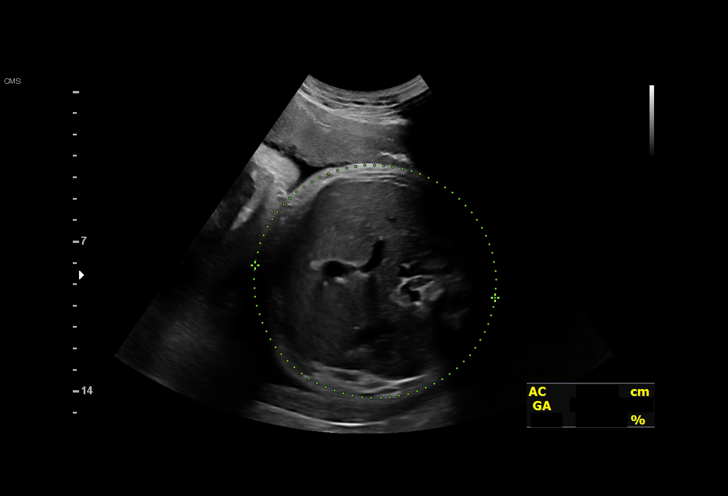

[13 of 28 positions shown; findings below may reference images not displayed]

[address]

 ----------------------------------------------------------------------

 ----------------------------------------------------------------------
Indications

  Antenatal screening for malformations
  Gestational diabetes in pregnancy, diet
  controlled (new diagnosis)
  35 weeks gestation of pregnancy
 ----------------------------------------------------------------------
Vital Signs

                                                Height:        5'3"
Fetal Evaluation

 Num Of Fetuses:         1
 Fetal Heart Rate(bpm):  141
 Cardiac Activity:       Observed
 Presentation:           Cephalic
 Placenta:               Anterior
 P. Cord Insertion:      Visualized

 Amniotic Fluid
 AFI FV:      Within normal limits

 AFI Sum(cm)     %Tile       Largest Pocket(cm)
 16.0            58

 RUQ(cm)       RLQ(cm)       LUQ(cm)        LLQ(cm)

Biophysical Evaluation

 Amniotic F.V:   Within normal limits       F. Tone:        Observed
 F. Movement:    Observed                   Score:          [DATE]
 F. Breathing:   Observed
Biometry

 BPD:      86.5  mm     G. Age:  34w 6d         49  %    CI:        71.29   %    70 - 86
                                                         FL/HC:      20.5   %    20.1 -
 HC:      326.3  mm     G. Age:  37w 0d         67  %    HC/AC:      0.95        0.93 -
 AC:      342.4  mm     G. Age:  38w 1d       > 99  %    FL/BPD:     77.2   %    71 - 87
 FL:       66.8  mm     G. Age:  34w 3d         26  %    FL/AC:      19.5   %    20 - 24
 HUM:      60.5  mm     G. Age:  35w 1d         67  %

 Est. FW:    9900  gm    6 lb 11 oz      90  %
OB History

 Gravidity:    1         Term:   0        Prem:   0        SAB:   0
 TOP:          0       Ectopic:  0        Living: 0
Gestational Age

 LMP:           36w 2d        Date:  09/27/18                 EDD:   07/04/19
 U/S Today:     36w 1d                                        EDD:   07/05/19
 Best:          35w 0d     Det. By:  Early Ultrasound         EDD:   07/13/19
                                     (12/06/18)
Anatomy

 Cranium:               Appears normal         LVOT:                   Appears normal
 Cavum:                 Appears normal         Aortic Arch:            Appears normal
 Ventricles:            Appears normal         Ductal Arch:            Not well visualized
 Choroid Plexus:        Appears normal         Diaphragm:              Appears normal
 Cerebellum:            Not well visualized    Stomach:                Appears normal, left
                                                                       sided
 Posterior Fossa:       Not well visualized    Abdomen:                Appears normal
 Nuchal Fold:           Not applicable (>20    Abdominal Wall:         Not well visualized
                        wks GA)
 Face:                  Appears normal         Cord Vessels:           Appears normal (3
                        (orbits and profile)                           vessel cord)
 Lips:                  Appears normal         Kidneys:                Appear normal
 Palate:                Appears normal         Bladder:                Appears normal
 Thoracic:              Appears normal         Spine:                  Limited views
                                                                       appear normal
 Heart:                 Appears normal         Upper Extremities:      Visualized
                        (4CH, axis, and
                        situs)
 RVOT:                  Appears normal         Lower Extremities:      Visualized

 Other:  Feet/heels visualized. Nasal bone visualized. Technically difficult due
         to advanced gestational age.
Cervix Uterus Adnexa

 Cervix
 Not visualized (advanced GA >75wks)

 Uterus
 No abnormality visualized.

 Cul De Sac
 No free fluid seen.
 Adnexa
 No abnormality visualized.
Impression

 Gestational diabetes.  Patient transferred her prenatal care to
 [facility].  She is just started checking her blood glucose.  On
 reviewing her log, I find postprandial levels are increased.
 She will be meeting with the diabetic educator on 06/11/2019.
 On ultrasound, the estimated fetal weight is at the 90th
 percentile.  Amniotic fluid is normal and good fetal activity
 seen.  Fetal anatomical survey appears normal, but limited by
 advanced gestational age.  Antenatal testing is reassuring.
 BPP [DATE].

 I discussed ultrasound findings and explained the importance
 of good blood glucose control to prevent adverse fetal and
 neonatal outcomes.
Recommendations

 -Continue weekly BPP till delivery.
                 Wiese, Deivis

## 2021-04-14 ENCOUNTER — Ambulatory Visit: Payer: Medicaid Other | Admitting: Nurse Practitioner

## 2021-04-22 IMAGING — US US MFM OB FOLLOW-UP
1 series · 14 of 28 positions shown · non-contrast
Comparison: none

[Series 1: us mfm ob follow-up · 48 acquisitions, 14 frames shown]
[im 2/48]
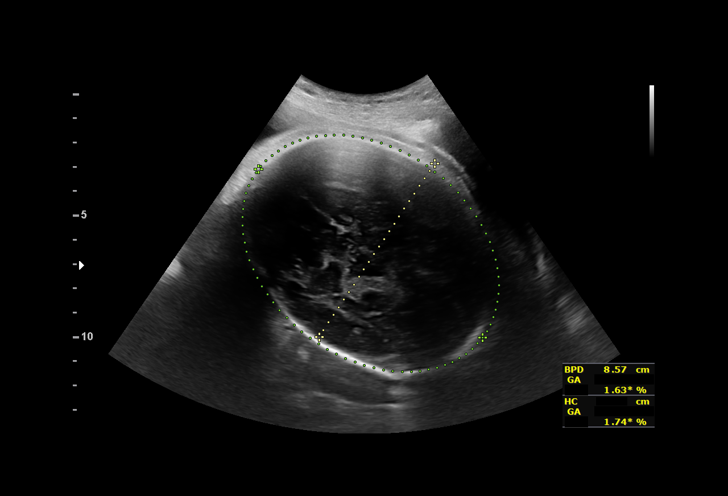
[im 6/48]
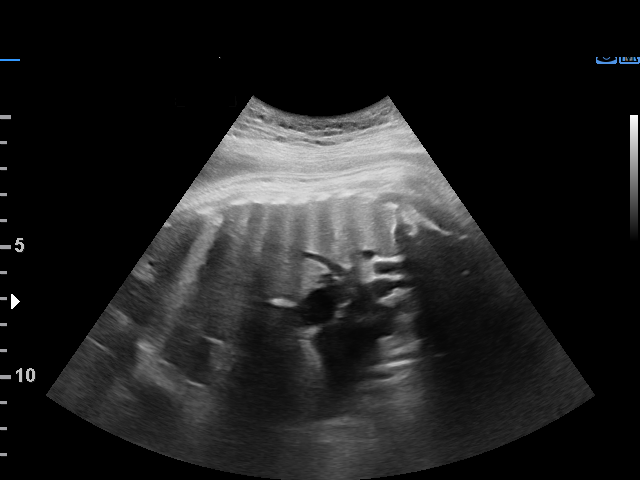
[im 9/48]
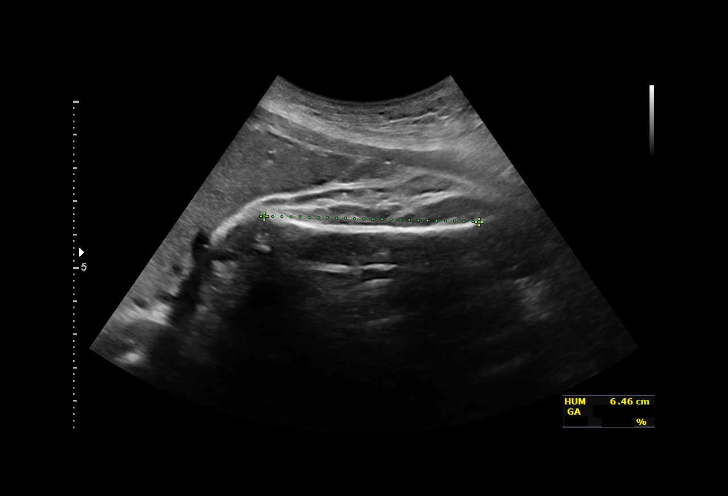
[im 13/48]
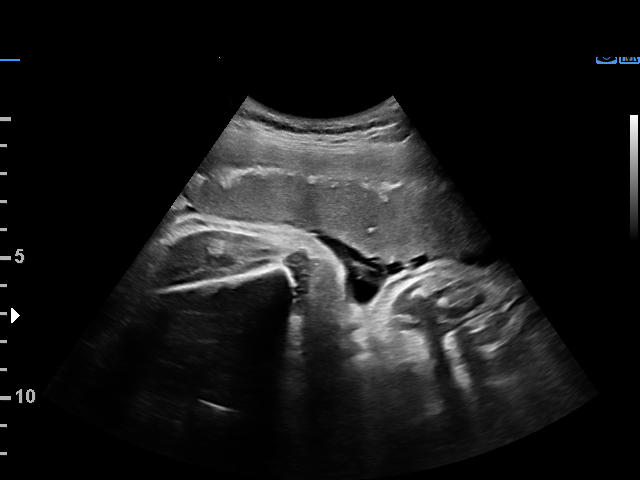
[im 16/48]
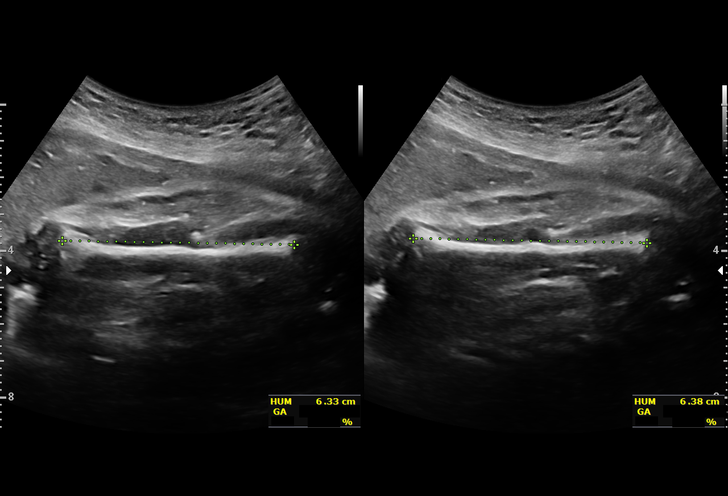
[im 20/48]
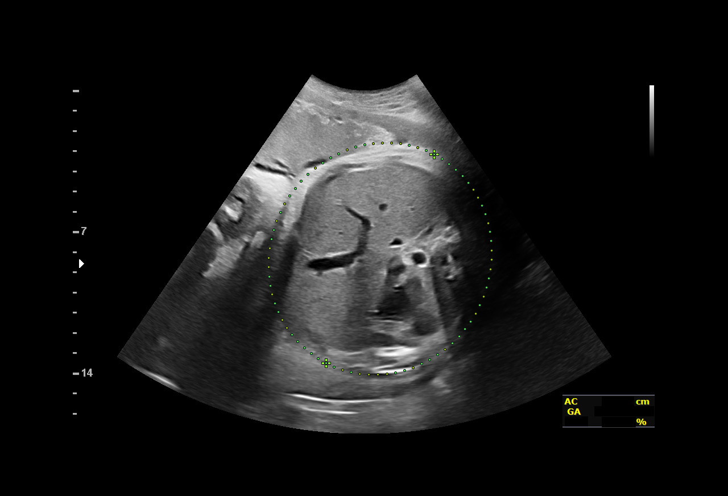
[im 23/48]
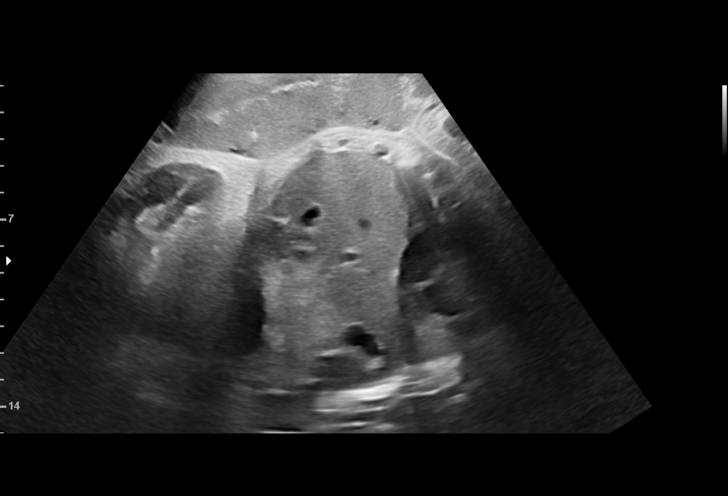
[im 27/48]
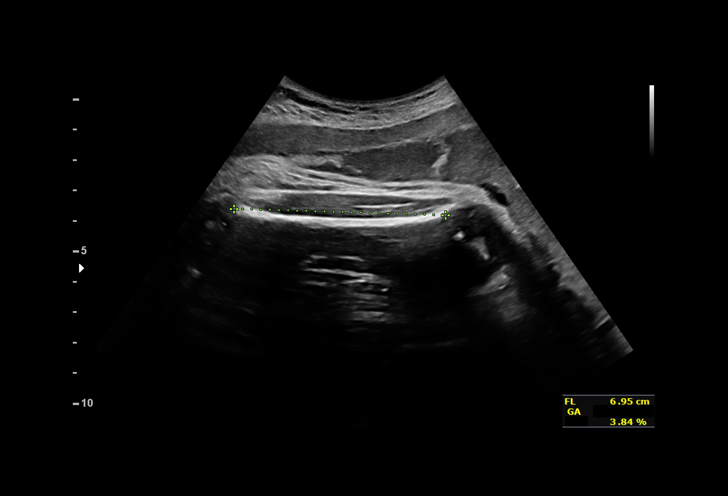
[im 30/48]
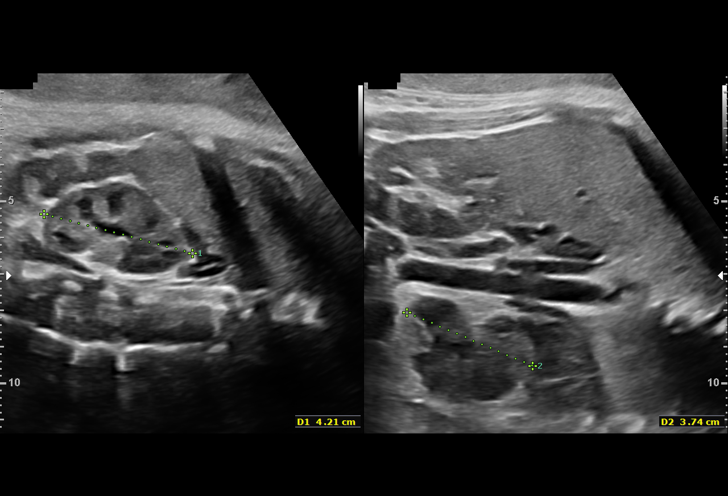
[im 34/48]
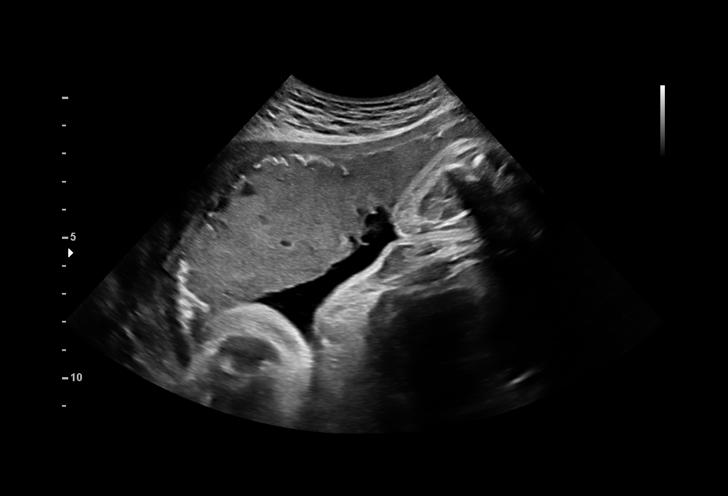
[im 37/48]
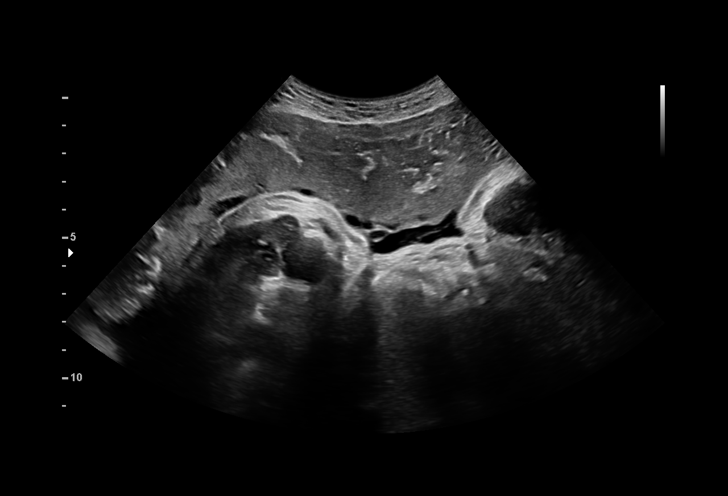
[im 41/48]
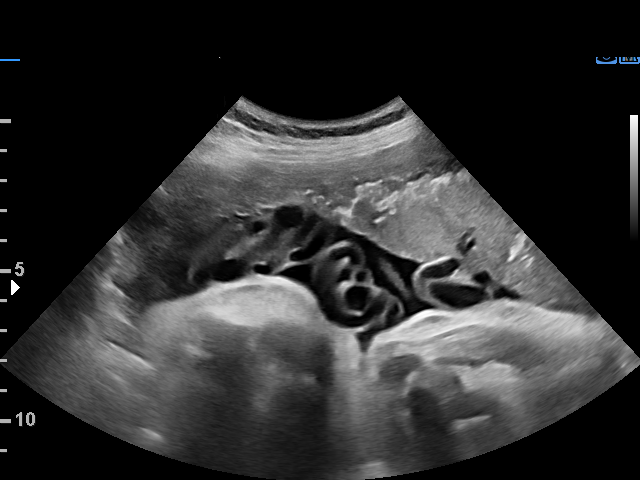
[im 44/48]
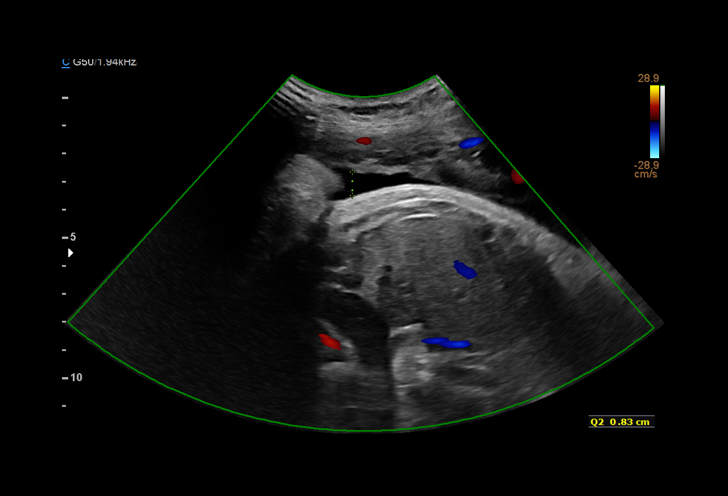
[im 48/48]
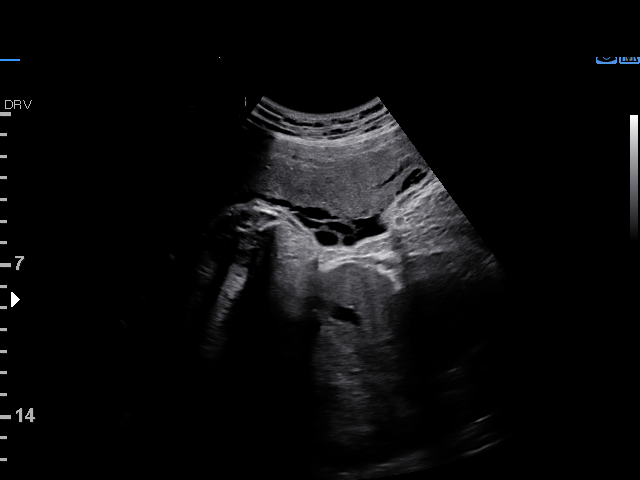

[14 of 28 positions shown; findings below may reference images not displayed]

[address]

 ----------------------------------------------------------------------

 ----------------------------------------------------------------------
Indications

  Gestational diabetes in pregnancy, insulin
  controlled
  38 weeks gestation of pregnancy
  Encounter for other antenatal screening
  follow-up
 ----------------------------------------------------------------------
Vital Signs

                                                Height:        5'3"
Fetal Evaluation

 Num Of Fetuses:         1
 Fetal Heart Rate(bpm):  133
 Cardiac Activity:       Observed
 Presentation:           Cephalic
 Placenta:               Anterior
 P. Cord Insertion:      Previously Visualized

 Amniotic Fluid
 AFI FV:      Within normal limits

 AFI Sum(cm)     %Tile       Largest Pocket(cm)
 7.69            8

 RUQ(cm)       RLQ(cm)       LUQ(cm)        LLQ(cm)

Biophysical Evaluation
 Amniotic F.V:   Within normal limits       F. Tone:        Observed
 F. Movement:    Observed                   Score:          [DATE]
 F. Breathing:   Observed
Biometry

 BPD:      85.7  mm     G. Age:  34w 4d          2  %    CI:        72.83   %    70 - 86
                                                         FL/HC:      22.1   %    20.9 -
 HC:      319.3  mm     G. Age:  36w 0d          2  %    HC/AC:      0.90        0.92 -
 AC:      354.5  mm     G. Age:  39w 3d         89  %    FL/BPD:     82.3   %    71 - 87
 FL:       70.5  mm     G. Age:  36w 1d          8  %    FL/AC:      19.9   %    20 - 24
 HUM:      63.9  mm     G. Age:  37w 0d         50  %

 Est. FW:    8619  gm      7 lb 3 oz     45  %
OB History

 Gravidity:    1         Term:   0        Prem:   0        SAB:   0
 TOP:          0       Ectopic:  0        Living: 0
Gestational Age

 LMP:           39w 5d        Date:  09/27/18                 EDD:   07/04/19
 U/S Today:     36w 4d                                        EDD:   07/26/19
 Best:          38w 3d     Det. By:  Early Ultrasound         EDD:   07/13/19
                                     (12/06/18)
Anatomy

 Cranium:               Appears normal         LVOT:                   Previously seen
 Cavum:                 Previously seen        Aortic Arch:            Previously seen
 Ventricles:            Previously seen        Ductal Arch:            Not well visualized
 Choroid Plexus:        Previously seen        Diaphragm:              Appears normal
 Cerebellum:            Not well visualized    Stomach:                Appears normal, left
                                                                       sided
 Posterior Fossa:       Not well visualized    Abdomen:                Appears normal
 Nuchal Fold:           Not applicable (>20    Abdominal Wall:         Not well visualized
                        wks GA)
 Face:                  Orbits and profile     Cord Vessels:           Previously seen
                        previously seen
 Lips:                  Previously seen        Kidneys:                Appear normal
 Palate:                Previously seen        Bladder:                Appears normal
 Thoracic:              Appears normal         Spine:                  Limited views
                                                                       appear nl prev seen
 Heart:                 Previously seen        Upper Extremities:      prev Visualized
 RVOT:                  Previously seen        Lower Extremities:      prev Visualized

 Other:  Feet/heels prev visualized. Nasal bone prev visualized. Technically
         difficult due to advanced gestational age.
Cervix Uterus Adnexa

 Cervix
 Not visualized (advanced GA >55wks)
Comments

 This patient was seen for a follow up growth scan due to A2
 gestational diabetes.  She denies any problems since her last
 exam.
 She was informed that the fetal growth and amniotic fluid
 level appears appropriate for her gestational age.
 A biophysical profile performed today was [DATE].
 The patient is already scheduled for delivery in 4 days.

## 2021-12-20 ENCOUNTER — Other Ambulatory Visit (HOSPITAL_COMMUNITY)
Admission: RE | Admit: 2021-12-20 | Discharge: 2021-12-20 | Disposition: A | Discharge status: Home / Self Care | Payer: Medicaid Other | Source: Ambulatory Visit | Attending: Family Medicine | Admitting: Family Medicine

## 2021-12-20 ENCOUNTER — Encounter: Payer: Self-pay | Admitting: Family Medicine

## 2021-12-20 ENCOUNTER — Ambulatory Visit: Payer: Medicaid Other | Attending: Family Medicine | Admitting: Family Medicine

## 2021-12-20 VITALS — BP 112/73 | HR 68 | Temp 98.2°F | Ht 62.0 in | Wt 114.2 lb

## 2021-12-20 DIAGNOSIS — E119 Type 2 diabetes mellitus without complications: Secondary | ICD-10-CM | POA: Diagnosis not present

## 2021-12-20 DIAGNOSIS — Z124 Encounter for screening for malignant neoplasm of cervix: Secondary | ICD-10-CM | POA: Insufficient documentation

## 2021-12-20 DIAGNOSIS — Z0000 Encounter for general adult medical examination without abnormal findings: Secondary | ICD-10-CM

## 2021-12-20 DIAGNOSIS — Z0001 Encounter for general adult medical examination with abnormal findings: Secondary | ICD-10-CM | POA: Diagnosis not present

## 2021-12-20 DIAGNOSIS — Z113 Encounter for screening for infections with a predominantly sexual mode of transmission: Secondary | ICD-10-CM

## 2021-12-20 DIAGNOSIS — Z1159 Encounter for screening for other viral diseases: Secondary | ICD-10-CM

## 2021-12-20 LAB — POCT GLYCOSYLATED HEMOGLOBIN (HGB A1C): HbA1c, POC (controlled diabetic range): 9.4 % — AB (ref 0.0–7.0)

## 2021-12-20 LAB — HEPATITIS C ANTIBODY: HCV Ab: NEGATIVE

## 2021-12-20 LAB — GLUCOSE, POCT (MANUAL RESULT ENTRY): POC Glucose: 140 mg/dl — AB (ref 70–99)

## 2021-12-20 MED ORDER — GLIPIZIDE 5 MG PO TABS: 5.0000 mg | ORAL_TABLET | Freq: Two times a day (BID) | ORAL | 3 refills | Status: DC

## 2021-12-20 NOTE — Patient Instructions (Signed)

## 2021-12-20 NOTE — Progress Notes (Signed)
Subjective:  Patient ID: Regina Hayes, female    DOB: 1996-04-09  Age: 26 y.o. MRN: [id number]  CC: Gynecologic Exam and Annual Exam   HPI Regina Hayes is a 26 y.o. year old female with a history of type 2 diabetes mellitus (A1c 9.4) who presents today for a complete physical exam. She last had a visit 17 months ago.  Interval History:  She is due for cervical cancer screening.  She has not been taking any medications for her Diabetes. As she states her medications made her nauseous. She had mentioned this at her last visit with me 17 months ago and Metformin was changed to Glipizide which she never picked up. She denies presence of excessive thirst, polyuria and has no additional concerns today.  Past Medical History:  Diagnosis Date   Diabetes mellitus without complication ([place])    Gestational diabetes    Medical history non-contributory    S/P appendectomy 12/25/2019    Past Surgical History:  Procedure Laterality Date   NO PAST SURGERIES      Family History  Family history unknown: Yes    Social History   Socioeconomic History   Marital status: Married    Spouse name: Not on file   Number of children: Not on file   Years of education: Not on file   Highest education level: Not on file  Occupational History   Not on file  Tobacco Use   Smoking status: Never   Smokeless tobacco: Never  Vaping Use   Vaping Use: Never used  Substance and Sexual Activity   Alcohol use: Not Currently   Drug use: Never   Sexual activity: Yes    Birth control/protection: None  Other Topics Concern   Not on file  Social History Narrative   Not on file   Social Determinants of Health   Financial Resource Strain: Not on file  Food Insecurity: No Food Insecurity (06/11/2019)   Hunger Vital Sign    Worried About Running Out of Food in the Last Year: Never true    Ran Out of Food in the Last Year: Never true  Transportation Needs: No Transportation Needs (06/11/2019)    PRAPARE - Hydrologist (Medical): No    Lack of Transportation (Non-Medical): No  Physical Activity: Not on file  Stress: Not on file  Social Connections: Not on file    No Known Allergies  Outpatient Medications Prior to Visit  Medication Sig Dispense Refill   glipiZIDE (GLUCOTROL) 5 MG tablet Take 0.5 tablets (2.5 mg total) by mouth daily before breakfast. 30 tablet 6   metFORMIN (GLUCOPHAGE) 500 MG tablet Take orally 1 tablet twice daily for 1 week then increase to 2 tablets twice daily thereafter. 120 tablet 3   ondansetron (ZOFRAN) 4 MG tablet Take 1 tablet (4 mg total) by mouth every 6 (six) hours. 12 tablet 0   Accu-Chek Softclix Lancets lancets Use as instructed (Patient not taking: Reported on 04/07/2020) 100 each 2   Blood Glucose Monitoring Suppl (ACCU-CHEK GUIDE) w/Device KIT 1 Units by Does not apply route 3 (three) times daily. (Patient not taking: Reported on 04/07/2020) 1 kit 0   Blood Pressure Monitoring DEVI 1 Device by Does not apply route once a week. (Patient not taking: Reported on 04/07/2020) 1 Device 0   norgestimate-ethinyl estradiol (ORTHO-CYCLEN) 0.25-35 MG-MCG tablet Take 1 tablet by mouth daily. (Patient not taking: Reported on 04/07/2020) 2 Package 0   Prenatal Vit-Fe Fumarate-FA (PRENATAL MULTIVITAMIN)  TABS tablet Take 1 tablet by mouth daily at 12 noon. (Patient not taking: Reported on 04/07/2020)     benzonatate (TESSALON) 100 MG capsule Take 1-2 capsules (100-200 mg total) by mouth 3 (three) times daily as needed for cough. (Patient not taking: Reported on 07/07/2020) 60 capsule 0   cetirizine (ZYRTEC ALLERGY) 10 MG tablet Take 1 tablet (10 mg total) by mouth daily. (Patient not taking: Reported on 07/07/2020) 30 tablet 0   docusate sodium (COLACE) 100 MG capsule Take 1 capsule (100 mg total) by mouth 2 (two) times daily. (Patient not taking: Reported on 08/04/2019) 10 capsule 0   erythromycin ophthalmic ointment Place 1 application  into the left eye at bedtime. (Patient not taking: Reported on 07/07/2020) 3.5 g 0   ibuprofen (ADVIL) 600 MG tablet Take 1 tablet (600 mg total) by mouth every 6 (six) hours. (Patient not taking: Reported on 08/04/2019) 30 tablet 0   promethazine-dextromethorphan (PROMETHAZINE-DM) 6.25-15 MG/5ML syrup Take 5 mLs by mouth at bedtime as needed for cough. (Patient not taking: Reported on 07/07/2020) 100 mL 0   pseudoephedrine (SUDAFED) 30 MG tablet Take 1 tablet (30 mg total) by mouth every 8 (eight) hours as needed for congestion. (Patient not taking: Reported on 07/07/2020) 30 tablet 0   No facility-administered medications prior to visit.     ROS Review of Systems  Constitutional:  Negative for activity change and appetite change.  HENT:  Negative for sinus pressure and sore throat.   Respiratory:  Negative for chest tightness, shortness of breath and wheezing.   Cardiovascular:  Negative for chest pain and palpitations.  Gastrointestinal:  Negative for abdominal distention, abdominal pain and constipation.  Genitourinary: Negative.   Musculoskeletal: Negative.   Psychiatric/Behavioral:  Negative for behavioral problems and dysphoric mood.     Objective:  BP 112/73   Pulse 68   Temp 98.2 F (36.8 C) (Oral)   Ht _0  (1.575 m)   Wt 114 lb 3.2 oz (51.8 kg)   SpO2 100%   BMI 20.89 kg/m      12/20/2021   10:26 AM 06/28/2020    3:59 PM 04/19/2020    7:23 PM  BP/Weight  Systolic BP 638 937 342  Diastolic BP 73 85 77  Wt. (Lbs) 114.2    BMI 20.89 kg/m2        Physical Exam Exam conducted with a chaperone present.  Constitutional:      General: She is not in acute distress.    Appearance: She is well-developed. She is not diaphoretic.  HENT:     Head: Normocephalic.     Right Ear: External ear normal.     Left Ear: External ear normal.     Nose: Nose normal.  Eyes:     Conjunctiva/sclera: Conjunctivae normal.     Pupils: Pupils are equal, round, and reactive to light.   Neck:     Vascular: No JVD.  Cardiovascular:     Rate and Rhythm: Normal rate and regular rhythm.     Heart sounds: Normal heart sounds. No murmur heard.    No gallop.  Pulmonary:     Effort: Pulmonary effort is normal. No respiratory distress.     Breath sounds: Normal breath sounds. No wheezing or rales.  Chest:     Chest wall: No tenderness.  Breasts:    Right: Normal. No mass, nipple discharge or tenderness.     Left: Normal. No mass, nipple discharge or tenderness.  Abdominal:  General: Bowel sounds are normal. There is no distension.     Palpations: Abdomen is soft. There is no mass.     Tenderness: There is no abdominal tenderness.     Hernia: There is no hernia in the left inguinal area or right inguinal area.  Genitourinary:    General: Normal vulva.     Pubic Area: No rash.      Labia:        Right: No rash.        Left: No rash.      Vagina: Normal.     Cervix: Normal.     Uterus: Normal.      Adnexa: Right adnexa normal and left adnexa normal.       Right: No tenderness.         Left: No tenderness.    Musculoskeletal:        General: No tenderness. Normal range of motion.     Cervical back: Normal range of motion. No tenderness.  Lymphadenopathy:     Upper Body:     Right upper body: No supraclavicular or axillary adenopathy.     Left upper body: No supraclavicular or axillary adenopathy.  Skin:    General: Skin is warm and dry.  Neurological:     Mental Status: She is alert and oriented to person, place, and time.     Deep Tendon Reflexes: Reflexes are normal and symmetric.        Latest Ref Rng & Units 04/07/2020   10:09 AM 12/06/2018    6:32 PM  CMP  Glucose 65 - 99 mg/dL 122  89   BUN 6 - 20 mg/dL 7  <5   Creatinine 0.57 - 1.00 mg/dL 0.59  0.41   Sodium 134 - 144 mmol/L 139  134   Potassium 3.5 - 5.2 mmol/L 4.1  3.3   Chloride 96 - 106 mmol/L 103  103   CO2 20 - 29 mmol/L 23  22   Calcium 8.7 - 10.2 mg/dL 9.6  9.1   Total Protein 6.5 -  8.1 g/dL  7.4   Total Bilirubin 0.3 - 1.2 mg/dL  0.2   Alkaline Phos 38 - 126 U/L  43   AST 15 - 41 U/L  17   ALT 0 - 44 U/L  14     Lipid Panel  No results found for: "CHOL", "TRIG", "HDL", "CHOLHDL", "VLDL", "LDLCALC", "LDLDIRECT"  CBC    Component Value Date/Time   WBC 9.6 07/09/2019 0508   RBC 3.27 (L) 07/09/2019 0508   HGB 9.5 (L) 07/09/2019 0508   HCT 29.0 (L) 07/09/2019 0508   PLT 138 (L) 07/09/2019 0508   MCV 88.7 07/09/2019 0508   MCH 29.1 07/09/2019 0508   MCHC 32.8 07/09/2019 0508   RDW 13.2 07/09/2019 0508    Lab Results  Component Value Date   HGBA1C 9.4 (A) 12/20/2021    Assessment & Plan:  1. Annual physical exam Counseled on 150 minutes of exercise per week, healthy eating (including decreased daily intake of saturated fats, cholesterol, added sugars, sodium), STI prevention, routine healthcare maintenance.  - CBC with Differential/Platelet  2. Type 2 diabetes mellitus without complication, without long-term current use of insulin (HCC) Uncontrolled with A1c of 9.4; goal is less than 7.0 Since she complains of nausea with both metformin and glipizide I have discussed that the next best step will be initiation of insulin given I do not expect adequate glycemic control with just an SGLT2i  or DPP 4 inhibitor but she declines an injectable She would like to try glipizide and I have informed her that if she is unable to tolerate this due to nausea we may be left with no option then to use an injectable Counseled on Diabetic diet, my plate method, 768 minutes of moderate intensity exercise/week Blood sugar logs with fasting goals of 80-120 mg/dl, random of less than 180 and in the event of sugars less than 60 mg/dl or greater than 400 mg/dl encouraged to notify the clinic. Advised on the need for annual eye exams, annual foot exams, Pneumonia vaccine. - Microalbumin/Creatinine Ratio, Urine - CMP14+EGFR - LP+Non-HDL Cholesterol - POCT glucose (manual entry) -  POCT glycosylated hemoglobin (Hb A1C) - glipiZIDE (GLUCOTROL) 5 MG tablet; Take 1 tablet (5 mg total) by mouth 2 (two) times daily before a meal.  Dispense: 60 tablet; Refill: 3  3. Screening for cervical cancer - Cytology - PAP  4. Screening for viral disease - HCV Ab w Reflex to Quant PCR  5. Screening for STD (sexually transmitted disease) - Cervicovaginal ancillary only    Meds ordered this encounter  Medications   glipiZIDE (GLUCOTROL) 5 MG tablet    Sig: Take 1 tablet (5 mg total) by mouth 2 (two) times daily before a meal.    Dispense:  60 tablet    Refill:  3    Discontinue Metformin    Follow-up: Return in about 3 months (around 03/22/2022) for Chronic medical conditions.       Charlott Rakes, MD, FAAFP. [facility] and [place], [place]   12/20/2021, 11:12 AM

## 2021-12-21 LAB — CMP14+EGFR
ALT: 13 IU/L (ref 0–32)
AST: 19 IU/L (ref 0–40)
Albumin/Globulin Ratio: 1.7 (ref 1.2–2.2)
Albumin: 4.9 g/dL (ref 3.9–5.0)
Alkaline Phosphatase: 70 IU/L (ref 44–121)
BUN/Creatinine Ratio: 18 (ref 9–23)
BUN: 11 mg/dL (ref 6–20)
Bilirubin Total: 0.8 mg/dL (ref 0.0–1.2)
CO2: 21 mmol/L (ref 20–29)
Calcium: 9.3 mg/dL (ref 8.7–10.2)
Chloride: 102 mmol/L (ref 96–106)
Creatinine, Ser: 0.61 mg/dL (ref 0.57–1.00)
Globulin, Total: 2.9 g/dL (ref 1.5–4.5)
Glucose: 129 mg/dL — ABNORMAL HIGH (ref 70–99)
Potassium: 4.3 mmol/L (ref 3.5–5.2)
Sodium: 138 mmol/L (ref 134–144)
Total Protein: 7.8 g/dL (ref 6.0–8.5)
eGFR: 126 mL/min/{1.73_m2} (ref >59)

## 2021-12-21 LAB — CBC WITH DIFFERENTIAL/PLATELET
Basophils Absolute: 0.1 10*3/uL (ref 0.0–0.2)
Basos: 1 %
EOS (ABSOLUTE): 0.4 10*3/uL (ref 0.0–0.4)
Eos: 5 %
Hematocrit: 34.7 % (ref 34.0–46.6)
Hemoglobin: 10.9 g/dL — ABNORMAL LOW (ref 11.1–15.9)
Immature Grans (Abs): 0 10*3/uL (ref 0.0–0.1)
Immature Granulocytes: 0 %
Lymphocytes Absolute: 2.2 10*3/uL (ref 0.7–3.1)
Lymphs: 31 %
MCH: 22.8 pg — ABNORMAL LOW (ref 26.6–33.0)
MCHC: 31.4 g/dL — ABNORMAL LOW (ref 31.5–35.7)
MCV: 72 fL — ABNORMAL LOW (ref 79–97)
Monocytes Absolute: 0.4 10*3/uL (ref 0.1–0.9)
Monocytes: 6 %
Neutrophils Absolute: 4 10*3/uL (ref 1.4–7.0)
Neutrophils: 57 %
Platelets: 215 10*3/uL (ref 150–450)
RBC: 4.79 x10E6/uL (ref 3.77–5.28)
RDW: 14.7 % (ref 11.7–15.4)
WBC: 7 10*3/uL (ref 3.4–10.8)

## 2021-12-21 LAB — MICROALBUMIN / CREATININE URINE RATIO
Creatinine, Urine: 80.6 mg/dL
Microalb/Creat Ratio: 8 mg/g creat (ref 0–29)
Microalbumin, Urine: 6.4 ug/mL

## 2021-12-21 LAB — LP+NON-HDL CHOLESTEROL
Cholesterol, Total: 152 mg/dL (ref 100–199)
HDL: 45 mg/dL (ref >39)
LDL Chol Calc (NIH): 94 mg/dL (ref 0–99)
Total Non-HDL-Chol (LDL+VLDL): 107 mg/dL (ref 0–129)
Triglycerides: 62 mg/dL (ref 0–149)
VLDL Cholesterol Cal: 13 mg/dL (ref 5–40)

## 2021-12-21 LAB — CERVICOVAGINAL ANCILLARY ONLY
Bacterial Vaginitis (gardnerella): NEGATIVE
Candida Glabrata: NEGATIVE
Candida Vaginitis: NEGATIVE
Chlamydia: NEGATIVE
Comment: NEGATIVE
Comment: NEGATIVE
Comment: NEGATIVE
Comment: NEGATIVE
Comment: NEGATIVE
Comment: NORMAL
Neisseria Gonorrhea: NEGATIVE
Trichomonas: NEGATIVE

## 2021-12-21 LAB — HCV INTERPRETATION

## 2021-12-21 LAB — CYTOLOGY - PAP
Adequacy: ABSENT
Diagnosis: NEGATIVE

## 2021-12-21 LAB — HCV AB W REFLEX TO QUANT PCR: HCV Ab: NONREACTIVE

## 2021-12-22 ENCOUNTER — Other Ambulatory Visit: Payer: Self-pay | Admitting: Family Medicine

## 2021-12-22 MED ORDER — IRON (FERROUS SULFATE) 325 (65 FE) MG PO TABS: 325.0000 mg | ORAL_TABLET | Freq: Every day | ORAL | 2 refills | Status: DC

## 2022-03-27 ENCOUNTER — Ambulatory Visit: Payer: Self-pay

## 2022-03-27 NOTE — Telephone Encounter (Signed)
Summary: Advice on glipizide because she is pregnant   The spouse of the patient called in because his wife is pregnant and she wants to make sure it is safe to continue taking glipiZIDE (GLUCOTROL) 5 MG tablet. Please assist patient       Called and spoke with husband. He is not on DPR. I am to call back after 5pm to speak with pt.

## 2022-03-27 NOTE — Telephone Encounter (Signed)
  Chief Complaint:Med question Symptoms: NA Frequency: NA Pertinent Negatives: Patient denies NA Disposition: [] ED /[] Urgent Care (no appt availability in office) / [] Appointment(In office/virtual)/ []  [facility]/ [] Home Care/ [] Refused Recommended Disposition /[] [facility]/ [x]  Follow-up with PCP Additional Notes:  Pt questioning if glipizide safe with pregnancy. Se is [redacted] weeks pregnant. Please advise Reason for Disposition . [1] Caller has URGENT medicine question about med that PCP or specialist prescribed AND [2] triager unable to answer question  Answer Assessment - Initial Assessment Questions 1. NAME of MEDICINE: "What medicine(s) are you calling about?"     Glipizide 2. QUESTION: "What is your question?" (e.g., double dose of medicine, side effect)     Safe to take when pregnant? 3. PRESCRIBER: "Who prescribed the medicine?" Reason: if prescribed by specialist, call should be referred to that group.      4. SYMPTOMS: "Do you have any symptoms?" If Yes, ask: "What symptoms are you having?"  "How bad are the symptoms (e.g., mild, moderate, severe)     NA 5. PREGNANCY:  "Is there any chance that you are pregnant?" "When was your last menstrual period?"     8 weeks  Protocols used: Medication Question Call-A-AH

## 2022-03-30 MED ORDER — GLYBURIDE 5 MG PO TABS: 5.0000 mg | ORAL_TABLET | Freq: Two times a day (BID) | ORAL | 0 refills | Status: DC

## 2022-03-30 NOTE — Addendum Note (Signed)
Addended by: Charlott Rakes on: 03/30/2022 09:26 AM   Modules accepted: Orders

## 2022-03-30 NOTE — Telephone Encounter (Signed)
Patient husband aware that new Rx at pharmacy.  Discontinue glipizide

## 2022-03-30 NOTE — Telephone Encounter (Signed)
I have switched her glipizide to glyburide and she needs to get established with OBGYN because they might likely switch her to insulin given she was unable to tolerate metformin in the past.

## 2022-04-04 ENCOUNTER — Ambulatory Visit: Payer: Self-pay | Admitting: *Deleted

## 2022-04-04 NOTE — Telephone Encounter (Signed)
  Chief Complaint: dizziness Symptoms: after Diabeta Frequency: daily since started Pertinent Negatives: Patient denies na Disposition: [] ED /[] Urgent Care (no appt availability in office) / [] Appointment(In office/virtual)/ []  [facility]/ [] Home Care/ [] Refused Recommended Disposition /[] [facility]/ [x]  Follow-up with PCP Additional Notes: Via Interpreter, Pt states dizzy and hungry since on Diabeta, her last CBG was 53, Pt advised to not take Diabeta tonight and someone from the office will advise her whether to start back.    Reason for Disposition  [1] Blood glucose 70 mg/dl (3.9 mmol/l) or below, OR symptomatic AND [2] cause known  Answer Assessment - Initial Assessment Questions 1. SYMPTOMS: "What symptoms are you concerned about?"     Dizziness since taking Diabeta 2. ONSET:  "When did the symptoms start?"     After taking Diabeta, she has been taking it  3. BLOOD GLUCOSE: "What is your blood glucose level?"      53 4. USUAL RANGE: "What is your blood glucose level usually?" (e.g., usual fasting morning value, usual evening value)     unknown 5. TYPE 1 or 2:  "Do you know what type of diabetes you have?"  (e.g., Type 1, Type 2, Gestational; doesn't know)      Type 2 6. INSULIN: "Do you take insulin?" "What type of insulin(s) do you use? What is the mode of delivery? (syringe, pen; injection or pump) "When did you last give yourself an insulin dose?" (i.e., time or hours/minutes ago) "How much did you give?" (i.e., how many units)     No, just diabeta 7. DIABETES PILLS: "Do you take any pills for your diabetes?" If Yes, ask: "What is the name of the medicine(s) that you take for high blood sugar?"     diabeta 8. OTHER SYMPTOMS: "Do you have any symptoms?" (e.g., fever, frequent urination, difficulty breathing, vomiting)     na 9. LOW BLOOD GLUCOSE TREATMENT: "What have you done so far to treat the low blood glucose level?"     Instructed juice and protein  like spoon of peanut butter 10. FOOD: "When did you last eat or drink?"       today 11. ALONE: "Are you alone right now or is someone with you?"        na 12. PREGNANCY: "Is there any chance you are pregnant?" "When was your last menstrual period?"       [redacted] weeks pregnant  Protocols used: Diabetes - Low Blood Sugar-A-AH

## 2022-04-05 NOTE — Telephone Encounter (Signed)
Pt has been called and informed to discontinue medication and to contact her OBGYN for DM management.

## 2022-04-05 NOTE — Telephone Encounter (Signed)
Please have her discontinue it and she needs to go see her OB/GYN who will manage her diabetes and her pregnancy as well.  Thanks

## 2022-04-05 NOTE — Telephone Encounter (Signed)
Routing to PCP for review.

## 2022-04-18 ENCOUNTER — Telehealth: Payer: Self-pay | Admitting: Emergency Medicine

## 2022-04-18 NOTE — Telephone Encounter (Signed)
Copied from [place] [phone]. Topic: General - Other >> Apr 18, 2022  4:07 PM Leitha Schuller wrote: Reason for CRM: Caller requesting a cb regarding medication and pt seeing a obgyn  Caller states pt does not understand the reasons why  Caller is not listed on dpr  Please assist further

## 2022-04-20 ENCOUNTER — Other Ambulatory Visit: Payer: Self-pay | Admitting: Family Medicine

## 2022-04-20 DIAGNOSIS — E119 Type 2 diabetes mellitus without complications: Secondary | ICD-10-CM

## 2022-04-23 NOTE — Telephone Encounter (Signed)
Pt was called and she states that she has an appointment with public health tomorrow regarding her pregnancy. I informed her that they will manage her diabetes and Margarita Rana will resume her care once her pregnancy is complete.

## 2022-04-26 LAB — OB RESULTS CONSOLE GC/CHLAMYDIA
Chlamydia: NEGATIVE
Neisseria Gonorrhea: NEGATIVE

## 2022-04-26 LAB — OB RESULTS CONSOLE ANTIBODY SCREEN: Antibody Screen: NEGATIVE

## 2022-04-26 LAB — OB RESULTS CONSOLE RUBELLA ANTIBODY, IGM: Rubella: NON-IMMUNE/NOT IMMUNE

## 2022-04-26 LAB — OB RESULTS CONSOLE ABO/RH: RH Type: POSITIVE

## 2022-04-26 LAB — OB RESULTS CONSOLE HIV ANTIBODY (ROUTINE TESTING): HIV: NONREACTIVE

## 2022-04-26 LAB — OB RESULTS CONSOLE VARICELLA ZOSTER ANTIBODY, IGG
Varicella: NON-IMMUNE/NOT IMMUNE
Varicella: NON-IMMUNE/NOT IMMUNE

## 2022-04-26 LAB — OB RESULTS CONSOLE HGB/HCT, BLOOD
HCT: 37 (ref 29–41)
Hemoglobin: 12.5
Hemoglobin: 12.5

## 2022-04-26 LAB — OB RESULTS CONSOLE RPR
RPR: NONREACTIVE
RPR: NONREACTIVE

## 2022-04-26 LAB — OB RESULTS CONSOLE HEPATITIS B SURFACE ANTIGEN: Hepatitis B Surface Ag: NEGATIVE

## 2022-04-26 LAB — OB RESULTS CONSOLE PLATELET COUNT: Platelets: 173

## 2022-04-27 ENCOUNTER — Other Ambulatory Visit: Payer: Self-pay | Admitting: Family Medicine

## 2022-05-02 ENCOUNTER — Encounter: Payer: Self-pay | Admitting: *Deleted

## 2022-05-02 LAB — HEMOGLOBIN A1C: Hemoglobin-A1c: 6.7

## 2022-05-02 LAB — URINALYSIS: E coli (STEC): POSITIVE

## 2022-05-03 ENCOUNTER — Encounter: Payer: Medicaid Other | Attending: Family Medicine | Admitting: Registered"

## 2022-05-03 ENCOUNTER — Ambulatory Visit (INDEPENDENT_AMBULATORY_CARE_PROVIDER_SITE_OTHER): Payer: Medicaid Other | Admitting: Registered"

## 2022-05-03 DIAGNOSIS — O24312 Unspecified pre-existing diabetes mellitus in pregnancy, second trimester: Secondary | ICD-10-CM | POA: Insufficient documentation

## 2022-05-03 DIAGNOSIS — O24111 Pre-existing diabetes mellitus, type 2, in pregnancy, first trimester: Secondary | ICD-10-CM | POA: Insufficient documentation

## 2022-05-03 DIAGNOSIS — Z3A13 13 weeks gestation of pregnancy: Secondary | ICD-10-CM | POA: Insufficient documentation

## 2022-05-03 NOTE — Progress Notes (Signed)
Patient was seen for Type 2 Diabetes self-management on 05/03/22  Start time 1120 and End time 1226   Estimated due date: 11/03/22; [redacted]w[redacted]d  Pt is new [facility] this clinic [facility] Pt initial OB scheduled for 05/29/22 Pt states she will bring in blood sugar log next week just in case she needs to start medication before she is seen by a doctor.  Clinical: Medications: reviewed Medical History: previous GDM controlled with insulin Labs: OGTT n/a, A1c 6.7%   Dietary and Lifestyle History: Pt states her family doctor, Dr Alvis Lemmings, prescribed glyburide 04/27/22 but told her to discontinued due to hypoglycemic sxs.    Pt states she works 7 am - 3:30 and doesn't always check blood sugar due to work schedule. Last time she checked BG was the day before yesterday.  Pt states she is afraid she will go hungry if she eats less rice than what she is currently eating.  Physical Activity: ADL sendentary work (sewing) Stress: not assessed Sleep: 8-10 hrs   24 hr Recall:  First Meal: coffee with a pinch of brown sugar, salty crackers Snack: Second meal: rice, lentils, vegetables, chicken, water Snack: sometimes fruit Third meal: chicken, rice Snack:none Beverages: 2-3 bottles water, 8 oz ~1x/weediet coke, 1 c coffee, milk tea  NUTRITION INTERVENTION  Nutrition education (E-1) on the following topics:   Initial Follow-up  [x]  []  Definition of Gestational Diabetes (Type 2 diabetes) [x]  []  Why dietary management is important in controlling blood glucose [x]  []  Effects each nutrient has on blood glucose levels []  []  Simple carbohydrates vs complex carbohydrates []  []  Fluid intake [x]  []  Creating a balanced meal plan []  []  Carbohydrate counting  [x]  []  When to check blood glucose levels [x]  []  Proper blood glucose monitoring techniques []  []  Effect of stress and stress reduction techniques  []  []  Exercise effect on blood glucose levels, appropriate exercise during  pregnancy []  []  Importance of limiting caffeine and abstaining from alcohol and smoking [x]  []  Medications used for blood sugar control during pregnancy []  []  Hypoglycemia and rule of 15 []  []  Postpartum self care  Patient has a meter prior to visit. Patient is not testing pre breakfast and 2 hours after each meal. Due to work schedule? FBS: 90-100  Postprandial: 183 mg/dL  Patient instructed to monitor glucose levels: FBS: 60 - ? 95 mg/dL (some clinics use 90 for cutoff) 1 hour: ? 140 mg/dL 2 hour: ? mg/dL  Patient received handouts: Nutrition Diabetes and Pregnancy Carbohydrate Counting List  Patient will be seen for follow-up as needed.

## 2022-05-07 ENCOUNTER — Encounter: Payer: Self-pay | Admitting: *Deleted

## 2022-05-08 ENCOUNTER — Encounter: Payer: Self-pay | Admitting: Registered"

## 2022-05-08 LAB — LEAD, BLOOD: Lead, Blood: <1

## 2022-05-08 NOTE — Addendum Note (Signed)
Addended by: Kathee Delton on: 05/08/2022 11:47 AM   Modules accepted: Orders

## 2022-05-08 NOTE — Progress Notes (Signed)
Pt has T2D in pregnancy and is a new patient transferred from the health department. Initial OB scheduled 05/29/22  A1c 6.7%.  MD told patient to discontinued glyburide 11/3 due to hypoglycemic sxs.  Pt was not checking blood sugar often prior to visit with diabetes educator. Stressed the importance of having this information to determine if medication is needed.   As requested patient dropped off blood sugar log. RD will evaluate log again at her follow-up visit next week and consult with MD at that time.

## 2022-05-10 ENCOUNTER — Encounter: Payer: Self-pay | Admitting: *Deleted

## 2022-05-15 ENCOUNTER — Encounter: Payer: Medicaid Other | Attending: Family Medicine | Admitting: Registered"

## 2022-05-15 ENCOUNTER — Ambulatory Visit (INDEPENDENT_AMBULATORY_CARE_PROVIDER_SITE_OTHER): Payer: Medicaid Other | Admitting: Registered"

## 2022-05-15 DIAGNOSIS — Z3A00 Weeks of gestation of pregnancy not specified: Secondary | ICD-10-CM | POA: Insufficient documentation

## 2022-05-15 DIAGNOSIS — E119 Type 2 diabetes mellitus without complications: Secondary | ICD-10-CM | POA: Diagnosis not present

## 2022-05-15 DIAGNOSIS — O24111 Pre-existing diabetes mellitus, type 2, in pregnancy, first trimester: Secondary | ICD-10-CM

## 2022-05-15 NOTE — Progress Notes (Signed)
Patient was seen for Type 2 Diabetes self-management on 05/03/22  Start time 1415 and End time 1455   Estimated due date: 11/03/22; [redacted]w[redacted]d  Pt is new to this clinic from health department Pt initial OB scheduled for 05/29/22  Clinical: Medications: Family MD, Dr Alvis Lemmings, glyburide RX 04/27/22 but told her to discontinued due to hypoglycemic sxs.  Medical History: previous GDM controlled with insulin Labs: OGTT n/a, A1c 6.7%     Patient wants another week to try diet and lifestyle before starting medication. Dr Crissie Reese reviewed log and agreed to give her 1 more week, but mostly likely has T2D and will need to start medication next week.  Dietary and Lifestyle History: Pt states she is confused by diagnosis and having to take medication. Reviewed pathophysiology again and importance for blood sugar control during pregnancy.  Pt states she states she continues to works 7 am - 3:30 pm with a 20 min break at 11:30 for lunch. Pt states when she remembers she will check her blood sugar at her 1:45 pm break. Pt states her lunch usually is 1 c rice, meat and vegetables. Pt states since she cut back her rice intake to 1 cup, she is hungry but doesn't want to eat more meat and vegetables. Pt states she likes nuts and eats them for snacks, doesn't like eggs.   Pt states she active after work with chores, but not exercising. We discussed alternatives to walking outside for her to get more physical activity.  Physical Activity: ADL sendentary work (sewing) Stress: not assessed Sleep: 8-10 hrs   24 hr Recall: not assessed this visit First Meal:  Snack: Second meal:  Snack:  Third meal:  Snack:none Beverages:   NUTRITION INTERVENTION  Nutrition education (E-1) on the following topics:   Initial Follow-up  [x]  [x]  Definition of Gestational Diabetes (Type 2 diabetes) [x]  [x]  Why dietary management is important in controlling blood glucose [x]  [x]  Effects each nutrient has on blood glucose  levels []  []  Simple carbohydrates vs complex carbohydrates []  []  Fluid intake [x]  [x]  Creating a balanced meal plan []  []  Carbohydrate counting  [x]  []  When to check blood glucose levels [x]  []  Proper blood glucose monitoring techniques []  []  Effect of stress and stress reduction techniques  []  []  Exercise effect on blood glucose levels, appropriate exercise during pregnancy []  []  Importance of limiting caffeine and abstaining from alcohol and smoking [x]  [x]  Medications used for blood sugar control during pregnancy []  []  Hypoglycemia and rule of 15 []  []  Postpartum self care  Pt dropped off log sheet and did not understand that she was supposed to continue checking BG until this visit.  Patient instructed to monitor glucose levels: FBS: 60 - ? 95 mg/dL  1 hour: ? mg/dL 2 hour: ? mg/dL  Patient received handouts: none  Patient will be seen for follow-up in 1 week or as needed.

## 2022-05-24 ENCOUNTER — Encounter: Payer: Medicaid Other | Attending: Family Medicine | Admitting: Registered"

## 2022-05-24 ENCOUNTER — Ambulatory Visit (INDEPENDENT_AMBULATORY_CARE_PROVIDER_SITE_OTHER): Payer: Medicaid Other | Admitting: Registered"

## 2022-05-24 ENCOUNTER — Other Ambulatory Visit: Payer: Self-pay | Admitting: Pharmacist

## 2022-05-24 DIAGNOSIS — O24111 Pre-existing diabetes mellitus, type 2, in pregnancy, first trimester: Secondary | ICD-10-CM

## 2022-05-24 DIAGNOSIS — Z713 Dietary counseling and surveillance: Secondary | ICD-10-CM | POA: Diagnosis not present

## 2022-05-24 MED ORDER — LANTUS SOLOSTAR 100 UNIT/ML ~~LOC~~ SOPN: 13.0000 [IU] | PEN_INJECTOR | Freq: Every day | SUBCUTANEOUS | 11 refills | Status: DC

## 2022-05-24 MED ORDER — INSULIN LISPRO (1 UNIT DIAL) 100 UNIT/ML (KWIKPEN): 4.0000 [IU] | PEN_INJECTOR | Freq: Two times a day (BID) | SUBCUTANEOUS | 11 refills | Status: DC

## 2022-05-24 MED ORDER — ACCU-CHEK FASTCLIX LANCETS MISC: 1.0000 | 11 refills | Status: DC | PRN

## 2022-05-24 MED ORDER — GLUCOSE BLOOD VI STRP: ORAL_STRIP | 11 refills | Status: DC

## 2022-05-24 MED ORDER — PEN NEEDLES 32G X 4 MM MISC: 11 refills | Status: DC

## 2022-05-24 NOTE — Progress Notes (Signed)
Patient was seen for Type 2 Diabetes in pregnancy self-management on 05/24/22  Start time 1415 and End time 1325  Started with [employer] Video Danelle Earthly [phone] / completed with In-person interpreter   Estimated due date: 11/03/22; [redacted]w[redacted]d  Pt is new to this clinic from health department Pt initial OB scheduled for 05/29/22  Clinical: Medications: Family MD, Dr Alvis Lemmings, glyburide RX 04/27/22 but told her to discontinued due to hypoglycemic sxs.  Medical History: previous GDM controlled with insulin Labs: OGTT n/a, A1c 6.7%     Patient used vial insulin last pregnancy. Today taught use of insulin pens. Patient demonstrated learning with teach back Discussed action of Lantus and Humalog and when to use Pt states knowledge of treating low blood sugar.  Physical Activity: ADL sendentary work (sewing) Stress: not assessed Sleep: 8-10 hrs   Plan: Per [name], CNM Patient is to start Lantus Solostar pen 13 units in the morning; Humalog Kwikpen 4 units bid with Lunch and Supper.   Patient will be seen for follow-up as needed.

## 2022-05-24 NOTE — Progress Notes (Signed)
Reviewed patient's blood glucose log with [name], CNM. Patient has some elevated fasting blood glucose (82-96) with high postprandial glucose after lunch and supper (130s-170s). Recommend starting Lantus 13 units in the morning and Humalog 4 units BID with lunch and supper. Plan to be more conservative given patients eating habits and morning glucose levels. Will have patient take Lantus dose in the morning given glucose in the morning are mainly within range and want to avoid nighttime hypoglycemia. Patient has a follow-up appointment on Tuesday 12/5 and can adjust insulin based on blood glucose logs at that time.  Thank you for allowing pharmacy to be a part of this patients care  Regina Hayes, Regina Hayes, Regina Hayes

## 2022-05-28 ENCOUNTER — Telehealth: Payer: Self-pay | Admitting: *Deleted

## 2022-05-28 DIAGNOSIS — O24111 Pre-existing diabetes mellitus, type 2, in pregnancy, first trimester: Secondary | ICD-10-CM

## 2022-05-28 MED ORDER — ACCU-CHEK SOFTCLIX LANCETS MISC: 12 refills | Status: DC

## 2022-05-28 MED ORDER — GLUCOSE BLOOD VI STRP: ORAL_STRIP | 12 refills | Status: DC

## 2022-05-28 NOTE — Telephone Encounter (Signed)
Called pt w/[employer] interpreter # [phone] and advised that we have received request from her pharmacy for glucose strips. Pt confirmed that she needs strips and lancets for her Accu-chek Guide meter. She was advised that prescriptions will be sent in today. She was reminded of appt in office tomorrow @ 9:55 am. Pt voiced understanding of all information and instructions given.

## 2022-05-29 ENCOUNTER — Ambulatory Visit (INDEPENDENT_AMBULATORY_CARE_PROVIDER_SITE_OTHER): Payer: Medicaid Other | Admitting: Obstetrics and Gynecology

## 2022-05-29 ENCOUNTER — Encounter: Payer: Self-pay | Admitting: Obstetrics and Gynecology

## 2022-05-29 VITALS — BP 105/67 | HR 68 | Wt 116.5 lb

## 2022-05-29 DIAGNOSIS — O0992 Supervision of high risk pregnancy, unspecified, second trimester: Secondary | ICD-10-CM

## 2022-05-29 DIAGNOSIS — Z789 Other specified health status: Secondary | ICD-10-CM | POA: Diagnosis not present

## 2022-05-29 DIAGNOSIS — O24414 Gestational diabetes mellitus in pregnancy, insulin controlled: Secondary | ICD-10-CM | POA: Diagnosis not present

## 2022-05-29 DIAGNOSIS — Z3A17 17 weeks gestation of pregnancy: Secondary | ICD-10-CM

## 2022-05-29 DIAGNOSIS — O0990 Supervision of high risk pregnancy, unspecified, unspecified trimester: Secondary | ICD-10-CM

## 2022-05-29 DIAGNOSIS — Z1332 Encounter for screening for maternal depression: Secondary | ICD-10-CM

## 2022-05-29 MED ORDER — ASPIRIN 81 MG PO TBEC: 81.0000 mg | DELAYED_RELEASE_TABLET | Freq: Every day | ORAL | 2 refills | Status: DC

## 2022-05-29 NOTE — Progress Notes (Signed)
Subjective:  Regina Hayes is a 26 y.o. G2P1001 at 2w3dbeing seen today for her first OB appt. EDD by LMP. Type 2 DM.  She is currently monitored for the following issues for this high-risk pregnancy and has Supervision of high risk pregnancy, antepartum; Gestational diabetes mellitus (GDM), antepartum; Language barrier affecting health care; and Pre-existing type 2 diabetes mellitus during pregnancy in first trimester on their problem list.  Patient reports no complaints.  Contractions: Not present. Vag. Bleeding: None.  Movement: Present. Denies leaking of fluid.   The following portions of the patient's history were reviewed and updated as appropriate: allergies, current medications, past family history, past medical history, past social history, past surgical history and problem list. Problem list updated.  Objective:   Vitals:   05/29/22 1044  BP: 105/67  Pulse: 68  Weight: 116 lb 8 oz (52.8 kg)    Fetal Status: Fetal Heart Rate (bpm): 147   Movement: Present     General:  Alert, oriented and cooperative. Patient is in no acute distress.  Skin: Skin is warm and dry. No rash noted.   Cardiovascular: Normal heart rate noted  Respiratory: Normal respiratory effort, no problems with respiration noted  Abdomen: Soft, gravid, appropriate for gestational age. Pain/Pressure: Absent     Pelvic:  Cervical exam deferred        Extremities: Normal range of motion.  Edema: None  Mental Status: Normal mood and affect. Normal behavior. Normal judgment and thought content.   Urinalysis:      Assessment and Plan:  Pregnancy: G2P1001 at 157w3d1. Supervision of high risk pregnancy, antepartum Prenatal care and labs reviewed with pt Genetic testing discussed - AFP, Serum, Open Spina Bifida - Panorama Prenatal Test Full Panel - HORIZON Custom - Culture, OB Urine  2. Insulin controlled gestational diabetes mellitus (GDM) during pregnancy, antepartum DM and pregnancy reviewed with pt Risk  reduction with good glycemic control reviewed with pt Serial growth scans and antenatal testing discussed Pt has seen DM education and started on insulin. No change  insulin regiment at this time Pt did not bring CBG readings with her Importance of bringing CBG readings to all OB appts reviewed CBG goals reviewed Not sure pt has a good understanding of DM and importance of diet and glucemic control  - [place] Echocardiography; Future - Protein / creatinine ratio, urine - Comp Met (CMET) - TSH - aspirin EC 81 MG tablet; Take 1 tablet (81 mg total) by mouth daily. Take after 12 weeks for prevention of preeclampsia later in pregnancy  Dispense: 300 tablet; Refill: 2 - Ambulatory referral to Ophthalmology  3. Language barrier affecting health care Live interrupter used during today's visit  Preterm labor symptoms and general obstetric precautions including but not limited to vaginal bleeding, contractions, leaking of fluid and fetal movement were reviewed in detail with the patient. Please refer to After Visit Summary for other counseling recommendations.  Return in about 2 weeks (around 06/12/2022) for OB visit, face to face, MD only.   ErChancy MilroyMD

## 2022-05-29 NOTE — Patient Instructions (Addendum)

## 2022-05-31 LAB — COMPREHENSIVE METABOLIC PANEL
ALT: 7 IU/L (ref 0–32)
AST: 14 IU/L (ref 0–40)
Albumin/Globulin Ratio: 1.5 (ref 1.2–2.2)
Albumin: 4.2 g/dL (ref 4.0–5.0)
Alkaline Phosphatase: 50 IU/L (ref 44–121)
BUN/Creatinine Ratio: 21 (ref 9–23)
BUN: 10 mg/dL (ref 6–20)
Bilirubin Total: 0.3 mg/dL (ref 0.0–1.2)
CO2: 19 mmol/L — ABNORMAL LOW (ref 20–29)
Calcium: 9 mg/dL (ref 8.7–10.2)
Chloride: 101 mmol/L (ref 96–106)
Creatinine, Ser: 0.47 mg/dL — ABNORMAL LOW (ref 0.57–1.00)
Globulin, Total: 2.8 g/dL (ref 1.5–4.5)
Glucose: 53 mg/dL — ABNORMAL LOW (ref 70–99)
Potassium: 3.4 mmol/L — ABNORMAL LOW (ref 3.5–5.2)
Sodium: 136 mmol/L (ref 134–144)
Total Protein: 7 g/dL (ref 6.0–8.5)
eGFR: 133 mL/min/{1.73_m2} (ref >59)

## 2022-05-31 LAB — AFP, SERUM, OPEN SPINA BIFIDA
AFP MoM: 0.92
AFP Value: 35.2 ng/mL
Gest. Age on Collection Date: 17.3 weeks
Maternal Age At EDD: 28.5 yr
OSBR Risk 1 IN: 4954
Test Results:: NEGATIVE
Weight: 116 [lb_av]

## 2022-05-31 LAB — TSH: TSH: 1.46 u[IU]/mL (ref 0.450–4.500)

## 2022-05-31 LAB — PROTEIN / CREATININE RATIO, URINE
Creatinine, Urine: 72.7 mg/dL
Protein, Ur: 37.8 mg/dL
Protein/Creat Ratio: 520 mg/g creat — ABNORMAL HIGH (ref 0–200)

## 2022-06-02 LAB — CULTURE, OB URINE

## 2022-06-02 LAB — URINE CULTURE, OB REFLEX

## 2022-06-03 LAB — PANORAMA PRENATAL TEST FULL PANEL:PANORAMA TEST PLUS 5 ADDITIONAL MICRODELETIONS: FETAL FRACTION: 11.9

## 2022-06-04 ENCOUNTER — Telehealth: Payer: Self-pay | Admitting: *Deleted

## 2022-06-04 ENCOUNTER — Encounter: Payer: Self-pay | Admitting: *Deleted

## 2022-06-04 ENCOUNTER — Encounter: Payer: Self-pay | Admitting: Obstetrics and Gynecology

## 2022-06-04 DIAGNOSIS — O2340 Unspecified infection of urinary tract in pregnancy, unspecified trimester: Secondary | ICD-10-CM | POA: Insufficient documentation

## 2022-06-04 MED ORDER — CEPHALEXIN 500 MG PO CAPS: 500.0000 mg | ORAL_CAPSULE | Freq: Three times a day (TID) | ORAL | 0 refills | Status: AC

## 2022-06-04 NOTE — Telephone Encounter (Addendum)
-----   Message from Hermina Staggers, MD sent at 06/04/2022 10:29 AM EST ----- Please let pt know that her panorama genetic test was low risk. Please let her know that she has a UTI Send in Rx for Keflex 500 mg po tid x 7 days Thanks Casimiro Needle   12/11  1510  Called pt w/[employer] interpreter [phone] and she did not answer. No voice mail set up - unable to leave message. Per chart review, pt does have active Mychart however does not read messages. Pt will be called at another time. Rx sent to pharmacy.

## 2022-06-05 NOTE — Telephone Encounter (Signed)
Called pt with [facility] Interpreter # [phone] informed pt that she has a UTI and that Keflex has been prescribed for treatment.  Pt states that she took an antibiotic that she was prescribed in the past for a UTI.  I explained to the pt that the medication may not be effective as it could be a different bacteria that needs to be treated.  I advised the pt to please go pick up the medication and take as prescribed.  Pt verbalized understanding with no further questions.    Leonette Nutting 06/05/22

## 2022-06-06 LAB — HORIZON CUSTOM: REPORT SUMMARY: NEGATIVE

## 2022-06-11 ENCOUNTER — Ambulatory Visit (HOSPITAL_BASED_OUTPATIENT_CLINIC_OR_DEPARTMENT_OTHER): Payer: Medicaid Other | Admitting: Obstetrics

## 2022-06-11 ENCOUNTER — Other Ambulatory Visit: Payer: Self-pay | Admitting: Nurse Practitioner

## 2022-06-11 ENCOUNTER — Encounter: Payer: Self-pay | Admitting: *Deleted

## 2022-06-11 ENCOUNTER — Ambulatory Visit: Payer: Medicaid Other | Admitting: *Deleted

## 2022-06-11 ENCOUNTER — Ambulatory Visit: Payer: Medicaid Other | Attending: Nurse Practitioner

## 2022-06-11 VITALS — BP 102/59 | HR 76

## 2022-06-11 DIAGNOSIS — Z363 Encounter for antenatal screening for malformations: Secondary | ICD-10-CM

## 2022-06-11 DIAGNOSIS — O24112 Pre-existing diabetes mellitus, type 2, in pregnancy, second trimester: Secondary | ICD-10-CM | POA: Diagnosis present

## 2022-06-11 DIAGNOSIS — O358XX0 Maternal care for other (suspected) fetal abnormality and damage, not applicable or unspecified: Secondary | ICD-10-CM

## 2022-06-11 DIAGNOSIS — O24319 Unspecified pre-existing diabetes mellitus in pregnancy, unspecified trimester: Secondary | ICD-10-CM | POA: Insufficient documentation

## 2022-06-11 DIAGNOSIS — Z8639 Personal history of other endocrine, nutritional and metabolic disease: Secondary | ICD-10-CM | POA: Insufficient documentation

## 2022-06-11 DIAGNOSIS — Z3A19 19 weeks gestation of pregnancy: Secondary | ICD-10-CM | POA: Diagnosis present

## 2022-06-11 NOTE — Progress Notes (Signed)
MFM Note  Regina Hayes was seen for a detailed fetal anatomy scan due to pregestational diabetes that is treated with insulin.  She was diagnosed with type 2 diabetes following her last pregnancy 3 years ago.  Her most recent hemoglobin A1c drawn 1 month ago was 6.7%.  She had a cell free DNA test earlier in her pregnancy which indicated a low risk for trisomy 69, 71, and 13. A female fetus is predicted.   She was informed that the fetal growth and amniotic fluid level were appropriate for her gestational age.   On today's exam, an intracardiac echogenic focus was noted in the left ventricle of the fetal heart.  The small association between an echogenic focus and Down syndrome was discussed.  Due to the echogenic focus noted today, the patient was offered and declined an amniocentesis today for definitive diagnosis of fetal aneuploidy.  She reports that she is comfortable with her negative cell free DNA test.  The patient was informed that anomalies may be missed due to technical limitations. If the fetus is in a suboptimal position or maternal habitus is increased, visualization of the fetus in the maternal uterus may be impaired.  The following were discussed during today's consultation:  Pregestational diabetes  The implications and management of diabetes in pregnancy was discussed in detail with the patient.   She was advised that our goals for her fingerstick values are fasting values of 90-95 or less and two-hour postprandials of 120 or less.    Should the majority of her fingerstick values be above these values, her insulin dosage may need to be adjusted to help her achieve better glycemic control.    The patient was advised that getting her fingerstick values as close to these goals as possible would provide her with the most optimal obstetrical outcome.  Due to pregestational diabetes, we will continue to follow her with monthly growth scans.    She was given a referral for a  fetal echocardiogram with [facility] pediatric cardiology.    Weekly fetal testing should be started at around 32 weeks.   The patient was advised that should the fetal growth appear normal and she maintains normal glucose control, delivery at around 39 weeks will be recommended.    Delivery will be recommended at 37 weeks should her glycemic control be poor.  A follow-up exam was scheduled in 4 weeks to assess the fetal growth and to complete the views of the fetal anatomy which were limited today due to the fetal position.    The patient stated that all of her questions were answered today.    All conversations were held with the patient today with the help of a [place] interpreter.  A total of 30 minutes was spent counseling and coordinating the care for this patient.  Greater than 50% of the time was spent in direct face-to-face contact.

## 2022-06-12 ENCOUNTER — Other Ambulatory Visit: Payer: Self-pay | Admitting: *Deleted

## 2022-06-12 DIAGNOSIS — Z362 Encounter for other antenatal screening follow-up: Secondary | ICD-10-CM

## 2022-06-21 ENCOUNTER — Other Ambulatory Visit: Payer: Self-pay

## 2022-06-21 ENCOUNTER — Ambulatory Visit (INDEPENDENT_AMBULATORY_CARE_PROVIDER_SITE_OTHER): Payer: Medicaid Other | Admitting: Obstetrics & Gynecology

## 2022-06-21 VITALS — BP 102/63 | HR 68 | Wt 122.0 lb

## 2022-06-21 DIAGNOSIS — O24111 Pre-existing diabetes mellitus, type 2, in pregnancy, first trimester: Secondary | ICD-10-CM

## 2022-06-21 DIAGNOSIS — Z3A20 20 weeks gestation of pregnancy: Secondary | ICD-10-CM

## 2022-06-21 DIAGNOSIS — Z789 Other specified health status: Secondary | ICD-10-CM

## 2022-06-21 DIAGNOSIS — O0992 Supervision of high risk pregnancy, unspecified, second trimester: Secondary | ICD-10-CM

## 2022-06-21 DIAGNOSIS — O0990 Supervision of high risk pregnancy, unspecified, unspecified trimester: Secondary | ICD-10-CM

## 2022-06-21 DIAGNOSIS — O24414 Gestational diabetes mellitus in pregnancy, insulin controlled: Secondary | ICD-10-CM

## 2022-06-21 DIAGNOSIS — O24112 Pre-existing diabetes mellitus, type 2, in pregnancy, second trimester: Secondary | ICD-10-CM

## 2022-06-21 MED ORDER — LANTUS SOLOSTAR 100 UNIT/ML ~~LOC~~ SOPN: 15.0000 [IU] | PEN_INJECTOR | Freq: Every day | SUBCUTANEOUS | 11 refills | Status: DC

## 2022-06-21 NOTE — Progress Notes (Signed)
   PRENATAL VISIT NOTE  Subjective:  Regina Hayes is a 26 y.o. G2P1001 at [redacted]w[redacted]d being seen today for ongoing prenatal care.  She is currently monitored for the following issues for this high-risk pregnancy and has Supervision of high risk pregnancy, antepartum; Gestational diabetes mellitus (GDM), antepartum; Language barrier affecting health care; Pre-existing type 2 diabetes mellitus during pregnancy in first trimester; and UTI (urinary tract infection) during pregnancy on their problem list.  Patient reports no complaints.  Contractions: Not present. Vag. Bleeding: None.  Movement: Present. Denies leaking of fluid.   The following portions of the patient's history were reviewed and updated as appropriate: allergies, current medications, past family history, past medical history, past social history, past surgical history and problem list.   Objective:   Vitals:   06/21/22 1549  BP: 102/63  Pulse: 68  Weight: 122 lb (55.3 kg)    Fetal Status: Fetal Heart Rate (bpm): 137   Movement: Present     General:  Alert, oriented and cooperative. Patient is in no acute distress.  Skin: Skin is warm and dry. No rash noted.   Cardiovascular: Normal heart rate noted  Respiratory: Normal respiratory effort, no problems with respiration noted  Abdomen: Soft, gravid, appropriate for gestational age.  Pain/Pressure: Present     Pelvic: Cervical exam deferred        Extremities: Normal range of motion.  Edema: None  Mental Status: Normal mood and affect. Normal behavior. Normal judgment and thought content.   Assessment and Plan:  Pregnancy: G2P1001 at [redacted]w[redacted]d 1. Insulin controlled gestational diabetes mellitus (GDM) during pregnancy, antepartum  - insulin glargine (LANTUS SOLOSTAR) 100 UNIT/ML Solostar Pen; Inject 15 Units into the skin daily.  Dispense: 15 mL; Refill: 11   Media Information  Document Information  Photos    06/21/2022 15:15  Attached To:  Routine Prenatal on 06/21/22 with  Adam Phenix, MD  Source Information  Adam Phenix, MD  [facility]   2. Language barrier affecting health care Nepali  3. Supervision of high risk pregnancy, antepartum   4. Pre-existing type 2 diabetes mellitus during pregnancy in first trimester Control is fair with some elevations, increased Lantus  Preterm labor symptoms and general obstetric precautions including but not limited to vaginal bleeding, contractions, leaking of fluid and fetal movement were reviewed in detail with the patient. Please refer to After Visit Summary for other counseling recommendations.   Return in about 4 weeks (around 07/19/2022).  Future Appointments  Date Time Provider Department Center  07/11/2022  8:30 AM [facility] NURSE [facility]  07/11/2022  8:45 AM [facility] US5 [facility]    Scheryl Darter, MD

## 2022-06-25 NOTE — L&D Delivery Note (Signed)
OB/GYN Faculty Practice Delivery Note  Regina Hayes is a 27 y.o. G2P1001 s/p SVD at [redacted]w[redacted]d. She was admitted for IOL for T2DM.   ROM: 1h 77m with clear fluid GBS Status:  NEGATIVE/-- (04/27 0824) Maximum Maternal Temperature:  Temp (24hrs), Avg:97.9 F (36.6 C), Min:97.8 F (36.6 C), Max:98 F (36.7 C)    Labor Progress: Patient arrived at 1 cm dilation and was induced with FB, cytotec, AROM .   Delivery Date/Time: 10/20/2022 at 1731 Delivery: Called to room and patient was complete and pushing. Head delivered in direct OA  position. No nuchal cord present. Shoulder and body delivered in usual fashion. Infant with spontaneous cry, placed on mother's abdomen, dried and stimulated. Cord clamped x 2 after 1-minute delay, and cut by FOB. Cord blood drawn. Placenta delivered spontaneously with gentle cord traction. Fundus firm with massage and Pitocin. Labia, perineum, vagina, and cervix inspected with 2nd degree perineal laceration .   Placenta: spontaenous, intact, 3 vessel cord  Complications: None Lacerations: 2nd degree perineal repaired EBL: 507 mL Analgesia: Lidocaine    Infant: APGAR (1 MIN): 9   APGAR (5 MINS): 9   APGAR (10 MINS):    Weight: Pending  Derrel Nip, MD  OB Fellow  10/20/2022 6:50 PM

## 2022-07-11 ENCOUNTER — Other Ambulatory Visit: Payer: Self-pay | Admitting: *Deleted

## 2022-07-11 ENCOUNTER — Ambulatory Visit: Payer: Medicaid Other | Attending: Maternal & Fetal Medicine

## 2022-07-11 ENCOUNTER — Ambulatory Visit: Payer: Medicaid Other | Admitting: *Deleted

## 2022-07-11 VITALS — BP 103/55 | HR 77

## 2022-07-11 DIAGNOSIS — O24112 Pre-existing diabetes mellitus, type 2, in pregnancy, second trimester: Secondary | ICD-10-CM | POA: Insufficient documentation

## 2022-07-11 DIAGNOSIS — Z794 Long term (current) use of insulin: Secondary | ICD-10-CM | POA: Diagnosis not present

## 2022-07-11 DIAGNOSIS — O283 Abnormal ultrasonic finding on antenatal screening of mother: Secondary | ICD-10-CM

## 2022-07-11 DIAGNOSIS — Z3689 Encounter for other specified antenatal screening: Secondary | ICD-10-CM

## 2022-07-11 DIAGNOSIS — E119 Type 2 diabetes mellitus without complications: Secondary | ICD-10-CM

## 2022-07-11 DIAGNOSIS — Z362 Encounter for other antenatal screening follow-up: Secondary | ICD-10-CM | POA: Diagnosis not present

## 2022-07-11 DIAGNOSIS — Z3A23 23 weeks gestation of pregnancy: Secondary | ICD-10-CM

## 2022-07-19 ENCOUNTER — Encounter: Payer: Medicaid Other | Admitting: Obstetrics & Gynecology

## 2022-07-23 ENCOUNTER — Ambulatory Visit (INDEPENDENT_AMBULATORY_CARE_PROVIDER_SITE_OTHER): Payer: Medicaid Other | Admitting: Obstetrics and Gynecology

## 2022-07-23 ENCOUNTER — Other Ambulatory Visit: Payer: Self-pay

## 2022-07-23 VITALS — BP 106/63 | HR 68 | Wt 129.2 lb

## 2022-07-23 DIAGNOSIS — O99612 Diseases of the digestive system complicating pregnancy, second trimester: Secondary | ICD-10-CM

## 2022-07-23 DIAGNOSIS — Z789 Other specified health status: Secondary | ICD-10-CM

## 2022-07-23 DIAGNOSIS — K5900 Constipation, unspecified: Secondary | ICD-10-CM | POA: Insufficient documentation

## 2022-07-23 DIAGNOSIS — Z3A25 25 weeks gestation of pregnancy: Secondary | ICD-10-CM

## 2022-07-23 DIAGNOSIS — O0990 Supervision of high risk pregnancy, unspecified, unspecified trimester: Secondary | ICD-10-CM

## 2022-07-23 DIAGNOSIS — O0992 Supervision of high risk pregnancy, unspecified, second trimester: Secondary | ICD-10-CM

## 2022-07-23 DIAGNOSIS — O24312 Unspecified pre-existing diabetes mellitus in pregnancy, second trimester: Secondary | ICD-10-CM

## 2022-07-23 MED ORDER — PRENATAL PLUS 27-1 MG PO TABS: 1.0000 | ORAL_TABLET | Freq: Every day | ORAL | 11 refills | Status: DC

## 2022-07-23 MED ORDER — DOCUSATE SODIUM 100 MG PO CAPS: 100.0000 mg | ORAL_CAPSULE | Freq: Two times a day (BID) | ORAL | 2 refills | Status: DC | PRN

## 2022-07-23 NOTE — Progress Notes (Signed)
   PRENATAL VISIT NOTE  Subjective:  Regina Hayes is a 27 y.o. G2P1001 at [redacted]w[redacted]d being seen today for ongoing prenatal care.  She is currently monitored for the following issues for this high-risk pregnancy and has Supervision of high risk pregnancy, antepartum; Gestational diabetes mellitus (GDM), antepartum; Language barrier affecting health care; Pre-existing diabetes mellitus affecting pregnancy in second trimester, antepartum; and UTI (urinary tract infection) during pregnancy on their problem list.  Patient doing well with no acute concerns today. She reports backache and constipation .  Contractions: Not present. Vag. Bleeding: None.  Movement: Present. Denies leaking of fluid.   The following portions of the patient's history were reviewed and updated as appropriate: allergies, current medications, past family history, past medical history, past social history, past surgical history and problem list. Problem list updated.  Objective:   Vitals:   07/23/22 1532  BP: 106/63  Pulse: 68  Weight: 129 lb 3.2 oz (58.6 kg)    Fetal Status: Fetal Heart Rate (bpm): 138   Movement: Present     General:  Alert, oriented and cooperative. Patient is in no acute distress.  Skin: Skin is warm and dry. No rash noted.   Cardiovascular: Normal heart rate noted  Respiratory: Normal respiratory effort, no problems with respiration noted  Abdomen: Soft, gravid, appropriate for gestational age.  Pain/Pressure: Absent     Pelvic: Cervical exam deferred        Extremities: Normal range of motion.  Edema: None  Mental Status:  Normal mood and affect. Normal behavior. Normal judgment and thought content.   Assessment and Plan:  Pregnancy: G2P1001 at [redacted]w[redacted]d  1. Supervision of high risk pregnancy, antepartum Continue routine prenatal care  - prenatal vitamin w/FE, FA (PRENATAL 1 + 1) 27-1 MG TABS tablet; Take 1 tablet by mouth daily at 12 noon.  Dispense: 30 tablet; Refill: 11  2. [redacted] weeks gestation  of pregnancy   3. Language barrier affecting health care Live interpreter present  4. Pre-existing diabetes mellitus affecting pregnancy in second trimester, antepartum FBS: 86-121, most in range PPBS: 72-186, majority not in range, increase regular insulin to 6 units with meals Pt has fetal echo scheduled in February.  5. Constipation in pregnancy: Advised increased hydration , pt only drinks 4 glasses of water per day. Rx sent for colace. Pt advised she can buy senokot or miralax over the counter, miralax may cause more abdominal cramping.  Preterm labor symptoms and general obstetric precautions including but not limited to vaginal bleeding, contractions, leaking of fluid and fetal movement were reviewed in detail with the patient.  Please refer to After Visit Summary for other counseling recommendations.   Return in about 2 weeks (around 08/06/2022) for [facility], in person, 3rd trim labs.   Lynnda Shields, MD Faculty Attending Center for [facility]

## 2022-07-23 NOTE — Patient Instructions (Signed)
Senokot Can also try miralax (more cramping)

## 2022-08-09 ENCOUNTER — Ambulatory Visit: Payer: Medicaid Other | Admitting: *Deleted

## 2022-08-09 ENCOUNTER — Ambulatory Visit: Payer: Medicaid Other | Attending: Maternal & Fetal Medicine

## 2022-08-09 ENCOUNTER — Other Ambulatory Visit: Payer: Self-pay | Admitting: *Deleted

## 2022-08-09 VITALS — BP 106/58 | HR 83

## 2022-08-09 DIAGNOSIS — O283 Abnormal ultrasonic finding on antenatal screening of mother: Secondary | ICD-10-CM | POA: Diagnosis present

## 2022-08-09 DIAGNOSIS — E119 Type 2 diabetes mellitus without complications: Secondary | ICD-10-CM

## 2022-08-09 DIAGNOSIS — Z3689 Encounter for other specified antenatal screening: Secondary | ICD-10-CM | POA: Diagnosis present

## 2022-08-09 DIAGNOSIS — Z794 Long term (current) use of insulin: Secondary | ICD-10-CM | POA: Diagnosis not present

## 2022-08-09 DIAGNOSIS — O24112 Pre-existing diabetes mellitus, type 2, in pregnancy, second trimester: Secondary | ICD-10-CM

## 2022-08-09 DIAGNOSIS — Z3A27 27 weeks gestation of pregnancy: Secondary | ICD-10-CM

## 2022-08-14 ENCOUNTER — Encounter: Payer: Medicaid Other | Admitting: Obstetrics and Gynecology

## 2022-08-17 ENCOUNTER — Other Ambulatory Visit: Payer: Self-pay | Admitting: *Deleted

## 2022-08-17 ENCOUNTER — Encounter: Payer: Medicaid Other | Admitting: Obstetrics and Gynecology

## 2022-08-17 ENCOUNTER — Other Ambulatory Visit: Payer: Medicaid Other

## 2022-08-17 DIAGNOSIS — O0990 Supervision of high risk pregnancy, unspecified, unspecified trimester: Secondary | ICD-10-CM

## 2022-08-20 ENCOUNTER — Other Ambulatory Visit: Payer: Medicaid Other

## 2022-08-20 ENCOUNTER — Ambulatory Visit (INDEPENDENT_AMBULATORY_CARE_PROVIDER_SITE_OTHER): Payer: Medicaid Other | Admitting: Obstetrics & Gynecology

## 2022-08-20 ENCOUNTER — Other Ambulatory Visit: Payer: Self-pay

## 2022-08-20 ENCOUNTER — Encounter: Payer: Self-pay | Admitting: Family Medicine

## 2022-08-20 VITALS — BP 105/69 | HR 71 | Wt 132.4 lb

## 2022-08-20 DIAGNOSIS — Z3A29 29 weeks gestation of pregnancy: Secondary | ICD-10-CM

## 2022-08-20 DIAGNOSIS — O0990 Supervision of high risk pregnancy, unspecified, unspecified trimester: Secondary | ICD-10-CM

## 2022-08-20 DIAGNOSIS — O24414 Gestational diabetes mellitus in pregnancy, insulin controlled: Secondary | ICD-10-CM

## 2022-08-20 DIAGNOSIS — O24312 Unspecified pre-existing diabetes mellitus in pregnancy, second trimester: Secondary | ICD-10-CM

## 2022-08-20 DIAGNOSIS — Z789 Other specified health status: Secondary | ICD-10-CM

## 2022-08-20 DIAGNOSIS — O0992 Supervision of high risk pregnancy, unspecified, second trimester: Secondary | ICD-10-CM

## 2022-08-20 MED ORDER — ACCU-CHEK SOFTCLIX LANCET DEV KIT: 1.0000 | PACK | Freq: Once | 0 refills | Status: AC

## 2022-08-20 MED ORDER — LANTUS SOLOSTAR 100 UNIT/ML ~~LOC~~ SOPN: 18.0000 [IU] | PEN_INJECTOR | Freq: Every day | SUBCUTANEOUS | 11 refills | Status: DC

## 2022-08-20 MED ORDER — INSULIN LISPRO (1 UNIT DIAL) 100 UNIT/ML (KWIKPEN): 8.0000 [IU] | PEN_INJECTOR | Freq: Two times a day (BID) | SUBCUTANEOUS | 11 refills | Status: DC

## 2022-08-20 NOTE — Progress Notes (Signed)
   PRENATAL VISIT NOTE  Subjective:  Regina Hayes is a 27 y.o. G2P1001 at 39w2dbeing seen today for ongoing prenatal care.  She is currently monitored for the following issues for this high-risk pregnancy and has Supervision of high risk pregnancy, antepartum; Language barrier affecting health care; Pre-existing diabetes mellitus affecting pregnancy in second trimester, antepartum; UTI (urinary tract infection) during pregnancy; and Constipation during pregnancy in second trimester on their problem list.  Patient reports no complaints.  Contractions: Not present. Vag. Bleeding: None.  Movement: Present. Denies leaking of fluid.   The following portions of the patient's history were reviewed and updated as appropriate: allergies, current medications, past family history, past medical history, past social history, past surgical history and problem list.   Objective:   Vitals:   08/20/22 0850  BP: 105/69  Pulse: 71  Weight: 60.1 kg    Fetal Status: Fetal Heart Rate (bpm): 138   Movement: Present     General:  Alert, oriented and cooperative. Patient is in no acute distress.  Skin: Skin is warm and dry. No rash noted.   Cardiovascular: Normal heart rate noted  Respiratory: Normal respiratory effort, no problems with respiration noted  Abdomen: Soft, gravid, appropriate for gestational age.  Pain/Pressure: Absent     Pelvic: Cervical exam deferred        Extremities: Normal range of motion.  Edema: None  Mental Status: Normal mood and affect. Normal behavior. Normal judgment and thought content.   Assessment and Plan:  Pregnancy: G2P1001 at 232w2d. Pre-existing diabetes mellitus affecting pregnancy in second trimester, antepartum See BG log in media need to increase her insulin dose - Lancets Misc. (ACCU-CHEK SOFTCLIX LANCET DEV) KIT; 1 kit by Does not apply route once for 1 dose.  Dispense: 1 kit; Refill: 0  2. Language barrier affecting health care Nepali  3. Supervision of  high risk pregnancy, antepartum   4. Insulin controlled gestational diabetes mellitus (GDM) during pregnancy, antepartum  - insulin lispro (HUMALOG KWIKPEN) 100 UNIT/ML KwikPen; Inject 8 Units into the skin 2 (two) times daily with a meal. Inject 4 units two (2) times daily with lunch and supper.  Dispense: 15 mL; Refill: 11 - insulin glargine (LANTUS SOLOSTAR) 100 UNIT/ML Solostar Pen; Inject 18 Units into the skin daily.  Dispense: 15 mL; Refill: 11 - Lancets Misc. (ACCU-CHEK SOFTCLIX LANCET DEV) KIT; 1 kit by Does not apply route once for 1 dose.  Dispense: 1 kit; Refill: 0  Preterm labor symptoms and general obstetric precautions including but not limited to vaginal bleeding, contractions, leaking of fluid and fetal movement were reviewed in detail with the patient. Please refer to After Visit Summary for other counseling recommendations.   Return in about 2 weeks (around 09/03/2022).  Future Appointments  Date Time Provider [place]/05/2023  8:35 AM [facility]ene [facility]unningMD [facility]/21/2024  9:15 AM [facility]20[facility]  9:30 AM [facility]28[facility]  8:15 AM [facility]  8:30 AM [facility]/09/2022  9:15 AM [facility]  9:30 AM [facility] US3 [facility]    JaEmeterio ReeveMD

## 2022-08-21 LAB — CBC
Hematocrit: 36.2 % (ref 34.0–46.6)
Hemoglobin: 12.4 g/dL (ref 11.1–15.9)
MCH: 29.3 pg (ref 26.6–33.0)
MCHC: 34.3 g/dL (ref 31.5–35.7)
MCV: 86 fL (ref 79–97)
Platelets: 144 10*3/uL — ABNORMAL LOW (ref 150–450)
RBC: 4.23 x10E6/uL (ref 3.77–5.28)
RDW: 13 % (ref 11.7–15.4)
WBC: 9.8 10*3/uL (ref 3.4–10.8)

## 2022-08-21 LAB — RPR: RPR Ser Ql: NONREACTIVE

## 2022-08-21 LAB — HIV ANTIBODY (ROUTINE TESTING W REFLEX): HIV Screen 4th Generation wRfx: NONREACTIVE

## 2022-08-23 ENCOUNTER — Other Ambulatory Visit: Payer: Self-pay | Admitting: General Practice

## 2022-09-03 NOTE — Progress Notes (Unsigned)
   PRENATAL VISIT NOTE  Subjective:  Regina Hayes is a 27 y.o. G2P1001 at [redacted]w[redacted]d being seen today for ongoing prenatal care.  She is currently monitored for the following issues for this high-risk pregnancy and has Supervision of high risk pregnancy, antepartum; Language barrier affecting health care; Pre-existing diabetes mellitus affecting pregnancy in second trimester, antepartum; UTI (urinary tract infection) during pregnancy; and Constipation during pregnancy in second trimester on their problem list.  Patient reports {sx:[id number]}.   .  .   . Denies leaking of fluid.   The following portions of the patient's history were reviewed and updated as appropriate: allergies, current medications, past family history, past medical history, past social history, past surgical history and problem list.   Objective:  There were no vitals filed for this visit.  Fetal Status:           General:  Alert, oriented and cooperative. Patient is in no acute distress.  Skin: Skin is warm and dry. No rash noted.   Cardiovascular: Normal heart rate noted  Respiratory: Normal respiratory effort, no problems with respiration noted  Abdomen: Soft, gravid, appropriate for gestational age.        Pelvic: Cervical exam deferred        Extremities: Normal range of motion.     Mental Status: Normal mood and affect. Normal behavior. Normal judgment and thought content.   Assessment and Plan:  Pregnancy: G2P1001 at [redacted]w[redacted]d 1. Pre-existing diabetes mellitus affecting pregnancy in second trimester, antepartum Current regimen: Insulin, Lantus 18 units, Humalog 8 units CBG review: *** Regimen changes: {Blank multiple:19196::"***","none"} Growth [place]: 2/15 - 40%ile, AC 84%ile, normal afi. Next growth 3/21. Antenatal monitoring: Begin antenatal testing at 32 weeks Other needs: None   2. Urinary tract infection in mother during third trimester of pregnancy - Urine culture today ***  3. Supervision of high risk  pregnancy, antepartum Tdap recommended today - pt ***  4. Language barrier affecting health care Interpreter used throughout the appointment.  MOC: ***  Preterm labor symptoms and general obstetric precautions including but not limited to vaginal bleeding, contractions, leaking of fluid and fetal movement were reviewed in detail with the patient. Please refer to After Visit Summary for other counseling recommendations.   No follow-ups on file.  Future Appointments  Date Time Provider [place]  09/04/2022  8:35 AM Radene Gunning, MD [facility]  09/13/2022  9:15 AM [facility] NURSE [facility]  09/13/2022  9:30 AM [facility] US2 [facility]  09/20/2022  8:15 AM [facility] NURSE [facility]  09/20/2022  8:30 AM [facility] US3 [facility]  09/27/2022  9:15 AM [facility] NURSE [facility]  09/27/2022  9:30 AM [facility] US3 [facility]    Radene Gunning, MD

## 2022-09-04 ENCOUNTER — Ambulatory Visit (INDEPENDENT_AMBULATORY_CARE_PROVIDER_SITE_OTHER): Payer: Medicaid Other | Admitting: Obstetrics and Gynecology

## 2022-09-04 VITALS — BP 107/60 | HR 71 | Wt 136.7 lb

## 2022-09-04 DIAGNOSIS — O24414 Gestational diabetes mellitus in pregnancy, insulin controlled: Secondary | ICD-10-CM

## 2022-09-04 DIAGNOSIS — O24313 Unspecified pre-existing diabetes mellitus in pregnancy, third trimester: Secondary | ICD-10-CM

## 2022-09-04 DIAGNOSIS — O0990 Supervision of high risk pregnancy, unspecified, unspecified trimester: Secondary | ICD-10-CM

## 2022-09-04 DIAGNOSIS — Z23 Encounter for immunization: Secondary | ICD-10-CM | POA: Diagnosis not present

## 2022-09-04 DIAGNOSIS — Z789 Other specified health status: Secondary | ICD-10-CM

## 2022-09-04 DIAGNOSIS — O2343 Unspecified infection of urinary tract in pregnancy, third trimester: Secondary | ICD-10-CM

## 2022-09-04 DIAGNOSIS — O0993 Supervision of high risk pregnancy, unspecified, third trimester: Secondary | ICD-10-CM

## 2022-09-04 DIAGNOSIS — Z3A31 31 weeks gestation of pregnancy: Secondary | ICD-10-CM

## 2022-09-04 DIAGNOSIS — O24312 Unspecified pre-existing diabetes mellitus in pregnancy, second trimester: Secondary | ICD-10-CM

## 2022-09-04 MED ORDER — LANTUS SOLOSTAR 100 UNIT/ML ~~LOC~~ SOPN: 18.0000 [IU] | PEN_INJECTOR | Freq: Two times a day (BID) | SUBCUTANEOUS | 11 refills | Status: DC

## 2022-09-04 MED ORDER — ACCU-CHEK FASTCLIX LANCETS MISC: 1.0000 | Freq: Four times a day (QID) | 11 refills | Status: DC

## 2022-09-06 LAB — CULTURE, OB URINE

## 2022-09-06 LAB — URINE CULTURE, OB REFLEX

## 2022-09-11 ENCOUNTER — Telehealth: Payer: Self-pay

## 2022-09-11 NOTE — Telephone Encounter (Addendum)
-----   Message from Radene Gunning, MD sent at 09/04/2022  9:28 AM EDT ----- Regarding: CBGs Please call her and get the last week's CBGs since changing insulin.   Thanks!  pad   Called pt with [employer] # [phone] and asked pt if she could when she gets a chance to send in her blood sugar values via MyChart.  Pt stated that she will be able to send values in MyChart after she gets off work at [employer].   I agreed.    Regina Hayes  09/11/22

## 2022-09-13 ENCOUNTER — Other Ambulatory Visit: Payer: Self-pay | Admitting: *Deleted

## 2022-09-13 ENCOUNTER — Ambulatory Visit: Payer: Medicaid Other | Admitting: *Deleted

## 2022-09-13 ENCOUNTER — Ambulatory Visit: Payer: Medicaid Other | Attending: Obstetrics and Gynecology

## 2022-09-13 DIAGNOSIS — Z794 Long term (current) use of insulin: Secondary | ICD-10-CM | POA: Diagnosis not present

## 2022-09-13 DIAGNOSIS — Z3A32 32 weeks gestation of pregnancy: Secondary | ICD-10-CM | POA: Diagnosis not present

## 2022-09-13 DIAGNOSIS — O24112 Pre-existing diabetes mellitus, type 2, in pregnancy, second trimester: Secondary | ICD-10-CM | POA: Insufficient documentation

## 2022-09-13 DIAGNOSIS — O24113 Pre-existing diabetes mellitus, type 2, in pregnancy, third trimester: Secondary | ICD-10-CM

## 2022-09-13 DIAGNOSIS — E119 Type 2 diabetes mellitus without complications: Secondary | ICD-10-CM | POA: Diagnosis not present

## 2022-09-13 DIAGNOSIS — Z3689 Encounter for other specified antenatal screening: Secondary | ICD-10-CM | POA: Diagnosis present

## 2022-09-20 ENCOUNTER — Ambulatory Visit: Payer: Medicaid Other | Attending: Obstetrics and Gynecology

## 2022-09-20 ENCOUNTER — Ambulatory Visit: Payer: Medicaid Other | Admitting: *Deleted

## 2022-09-20 VITALS — BP 105/64 | HR 93

## 2022-09-20 DIAGNOSIS — Z3689 Encounter for other specified antenatal screening: Secondary | ICD-10-CM | POA: Diagnosis present

## 2022-09-20 DIAGNOSIS — E119 Type 2 diabetes mellitus without complications: Secondary | ICD-10-CM

## 2022-09-20 DIAGNOSIS — Z794 Long term (current) use of insulin: Secondary | ICD-10-CM | POA: Diagnosis not present

## 2022-09-20 DIAGNOSIS — O24113 Pre-existing diabetes mellitus, type 2, in pregnancy, third trimester: Secondary | ICD-10-CM

## 2022-09-20 DIAGNOSIS — Z3A33 33 weeks gestation of pregnancy: Secondary | ICD-10-CM

## 2022-09-20 DIAGNOSIS — O24112 Pre-existing diabetes mellitus, type 2, in pregnancy, second trimester: Secondary | ICD-10-CM

## 2022-09-24 NOTE — Telephone Encounter (Signed)
Attempted to contact several times and unable to contact.  Notes placed in patients appointment notes to follow up.    Frances Nickels

## 2022-09-27 ENCOUNTER — Ambulatory Visit: Payer: Medicaid Other | Attending: Obstetrics and Gynecology

## 2022-09-27 ENCOUNTER — Ambulatory Visit (INDEPENDENT_AMBULATORY_CARE_PROVIDER_SITE_OTHER): Payer: Medicaid Other | Admitting: Advanced Practice Midwife

## 2022-09-27 ENCOUNTER — Ambulatory Visit: Payer: Medicaid Other | Admitting: *Deleted

## 2022-09-27 VITALS — BP 97/62 | HR 79 | Wt 141.4 lb

## 2022-09-27 VITALS — BP 111/54 | HR 73

## 2022-09-27 DIAGNOSIS — O0993 Supervision of high risk pregnancy, unspecified, third trimester: Secondary | ICD-10-CM

## 2022-09-27 DIAGNOSIS — O24112 Pre-existing diabetes mellitus, type 2, in pregnancy, second trimester: Secondary | ICD-10-CM | POA: Insufficient documentation

## 2022-09-27 DIAGNOSIS — Z3689 Encounter for other specified antenatal screening: Secondary | ICD-10-CM | POA: Diagnosis present

## 2022-09-27 DIAGNOSIS — O24113 Pre-existing diabetes mellitus, type 2, in pregnancy, third trimester: Secondary | ICD-10-CM

## 2022-09-27 DIAGNOSIS — Z794 Long term (current) use of insulin: Secondary | ICD-10-CM

## 2022-09-27 DIAGNOSIS — Z758 Other problems related to medical facilities and other health care: Secondary | ICD-10-CM

## 2022-09-27 DIAGNOSIS — Z3A34 34 weeks gestation of pregnancy: Secondary | ICD-10-CM

## 2022-09-27 DIAGNOSIS — E119 Type 2 diabetes mellitus without complications: Secondary | ICD-10-CM

## 2022-09-27 DIAGNOSIS — O0990 Supervision of high risk pregnancy, unspecified, unspecified trimester: Secondary | ICD-10-CM

## 2022-09-27 DIAGNOSIS — O24313 Unspecified pre-existing diabetes mellitus in pregnancy, third trimester: Secondary | ICD-10-CM

## 2022-09-27 DIAGNOSIS — O24414 Gestational diabetes mellitus in pregnancy, insulin controlled: Secondary | ICD-10-CM

## 2022-09-27 DIAGNOSIS — E1165 Type 2 diabetes mellitus with hyperglycemia: Secondary | ICD-10-CM

## 2022-09-27 DIAGNOSIS — Z603 Acculturation difficulty: Secondary | ICD-10-CM

## 2022-09-27 DIAGNOSIS — O24312 Unspecified pre-existing diabetes mellitus in pregnancy, second trimester: Secondary | ICD-10-CM

## 2022-09-27 MED ORDER — LANTUS SOLOSTAR 100 UNIT/ML ~~LOC~~ SOPN: 24.0000 [IU] | PEN_INJECTOR | Freq: Two times a day (BID) | SUBCUTANEOUS | 11 refills | Status: DC

## 2022-09-27 MED ORDER — INSULIN LISPRO (1 UNIT DIAL) 100 UNIT/ML (KWIKPEN)
9.0000 [IU] | PEN_INJECTOR | Freq: Two times a day (BID) | SUBCUTANEOUS | 11 refills | Status: DC
Start: 2022-09-27 — End: 2022-10-22

## 2022-09-27 NOTE — Progress Notes (Signed)
   PRENATAL VISIT NOTE  Subjective:  Regina Hayes is a 27 y.o. G2P1001 at [redacted]w[redacted]d being seen today for ongoing prenatal care.  She is currently monitored for the following issues for this high-risk pregnancy and has Supervision of high risk pregnancy, antepartum; Language barrier affecting health care; Pre-existing diabetes mellitus affecting pregnancy in second trimester, antepartum; UTI (urinary tract infection) during pregnancy; and Constipation during pregnancy in second trimester on their problem list.  Patient reports no complaints.  Contractions: Not present. Vag. Bleeding: None.  Movement: Present. Denies leaking of fluid.   The following portions of the patient's history were reviewed and updated as appropriate: allergies, current medications, past family history, past medical history, past social history, past surgical history and problem list.   Insulin was increased at MFM appt 09/13/22 to Lantus 22U BID. Continued Humalog 8 U w/ lunch and dinner.  Objective:   Vitals:   09/27/22 1157  BP: 97/62  Pulse: 79  Weight: 141 lb 6.4 oz (64.1 kg)    Fetal Status: Fetal Heart Rate (bpm): 144   Movement: Present     General:  Alert, oriented and cooperative. Patient is in no acute distress.  Skin: Skin is warm and dry. No rash noted.   Cardiovascular: Normal heart rate noted  Respiratory: Normal respiratory effort, no problems with respiration noted  Abdomen: Soft, gravid, appropriate for gestational age.  Pain/Pressure: Present     Pelvic: Cervical exam deferred        Extremities: Normal range of motion.  Edema: None  Mental Status: Normal mood and affect. Normal behavior. Normal judgment and thought content.   Fasting CBGs: >95 (0% out of range) PCB:  86-231  (<50% out of range) PCL:  103-224 (100% out of range) PCD: 156-261 (100% out of range)  [place] 09/13/22:  Estimated fetal weight at the 62 percentile. Amniotic fluid volume: Subjectively upper-normal. AFI: 22.89 cm.  MVP:  8.45 cm. Placenta: Anterior  Assessment and Plan:  Pregnancy: G2P1001 at [redacted]w[redacted]d 1. Pre-existing diabetes mellitus affecting pregnancy in second trimester, antepartum--Uncontrolled - Insulin increased in consultation with Diabetes educator - Increase Lantus insulin (Glargine) to 24 Units morning and night - Increase Humalog insulin (Lispro) 9 Units with lunch and dinner - Instructed to send a MyChart message with blood sugars on Monday 4/8.  - Antenatal testing and growth US's per MFM  - MFM recommends delivery 37-38 weeks  2. Language barrier affecting health care - Nepali interpreter used  3. Supervision of high risk pregnancy, antepartum  4. [redacted] weeks gestation of pregnancy  Preterm labor symptoms and general obstetric precautions including but not limited to vaginal bleeding, contractions, leaking of fluid and fetal movement were reviewed in detail with the patient. Please refer to After Visit Summary for other counseling recommendations.   No follow-ups on file.  Future Appointments  Date Time Provider [place]  10/04/2022  2:00 PM [facility] NURSE [facility]  10/04/2022  2:15 PM [facility] NST [facility]  10/11/2022  3:30 PM [facility] NURSE [facility]  10/11/2022  3:45 PM [facility] US6 [facility]  10/18/2022  2:00 PM [facility] NURSE [facility]  10/18/2022  2:15 PM [facility] NST [facility]  10/18/2022  3:55 PM Griffin Basil, MD [facility], CNM

## 2022-09-27 NOTE — Patient Instructions (Addendum)
Increase Lantus insulin (Glargine) to 24 Units morning and night Increase Humalog insulin (Lispro) 9 UNits with lunch and dinner  Send a MyChart message with blood sugars on Monday 4/8.

## 2022-10-01 ENCOUNTER — Telehealth: Payer: Self-pay | Admitting: *Deleted

## 2022-10-01 ENCOUNTER — Encounter: Payer: Self-pay | Admitting: Advanced Practice Midwife

## 2022-10-01 NOTE — Telephone Encounter (Signed)
Patient sent log of cbg's as requested. Dr. Shawnie Pons reviewed blood sugars and insulin regimen. She reccommends patient increase dinner time humalog kwikpen from 9 units to 12 units. I called Makennah with [employer] # [phone] and informed her we got her blood sugar log and Dr. Shawnie Pons recommends she increase dinner dose to 12 units [employer]. We reviewed all insulin doses and she voices understanding. I asked her to send in her log again on Thursday or Friday. She voices understanding. Nancy Fetter

## 2022-10-04 ENCOUNTER — Ambulatory Visit: Payer: Medicaid Other | Admitting: *Deleted

## 2022-10-04 ENCOUNTER — Ambulatory Visit: Payer: Medicaid Other | Attending: Maternal & Fetal Medicine | Admitting: *Deleted

## 2022-10-04 VITALS — BP 104/66 | HR 90

## 2022-10-04 DIAGNOSIS — O24113 Pre-existing diabetes mellitus, type 2, in pregnancy, third trimester: Secondary | ICD-10-CM | POA: Diagnosis present

## 2022-10-04 DIAGNOSIS — Z3A35 35 weeks gestation of pregnancy: Secondary | ICD-10-CM

## 2022-10-04 NOTE — Procedures (Signed)
Regina Hayes Oct 27, 1995 [redacted]w[redacted]d  Fetus A Non-Stress Test Interpretation for 10/04/22  Indication: Diabetes Type 2  Fetal Heart Rate A Mode: External Baseline Rate (A): 140 bpm Variability: Moderate Accelerations: 15 x 15 Decelerations: None Multiple birth?: No  Uterine Activity Mode: Toco Contraction Frequency (min): 3-4 Contraction Duration (sec): 60-80 Contraction Quality: Mild Resting Tone Palpated: Relaxed Resting Time: Adequate  Interpretation (Fetal Testing) Nonstress Test Interpretation: Reactive Overall Impression: Reassuring for gestational age Comments: Tracing reviewed byDr. Parke Poisson

## 2022-10-08 ENCOUNTER — Telehealth: Payer: Self-pay | Admitting: Lactation Services

## 2022-10-08 NOTE — Telephone Encounter (Signed)
Received a request to clarify Lispro Insulin RX. Per chart reviewed patient it to be taking 9 units with lunch and dinner.   Called pharmacy and spoke with Pharmacist to clarify prescription.

## 2022-10-10 ENCOUNTER — Encounter: Payer: Medicaid Other | Admitting: Family Medicine

## 2022-10-11 ENCOUNTER — Ambulatory Visit: Payer: Medicaid Other | Admitting: *Deleted

## 2022-10-11 ENCOUNTER — Ambulatory Visit: Payer: Medicaid Other | Attending: Maternal & Fetal Medicine

## 2022-10-11 VITALS — BP 110/66 | HR 68

## 2022-10-11 DIAGNOSIS — O24113 Pre-existing diabetes mellitus, type 2, in pregnancy, third trimester: Secondary | ICD-10-CM | POA: Diagnosis not present

## 2022-10-11 DIAGNOSIS — Z3A36 36 weeks gestation of pregnancy: Secondary | ICD-10-CM | POA: Diagnosis not present

## 2022-10-11 DIAGNOSIS — E119 Type 2 diabetes mellitus without complications: Secondary | ICD-10-CM

## 2022-10-11 DIAGNOSIS — Z794 Long term (current) use of insulin: Secondary | ICD-10-CM | POA: Diagnosis not present

## 2022-10-18 ENCOUNTER — Ambulatory Visit: Payer: Medicaid Other | Attending: Maternal & Fetal Medicine | Admitting: *Deleted

## 2022-10-18 ENCOUNTER — Other Ambulatory Visit (HOSPITAL_COMMUNITY)
Admission: RE | Admit: 2022-10-18 | Discharge: 2022-10-18 | Disposition: A | Discharge status: Home / Self Care | Payer: Medicaid Other | Source: Ambulatory Visit | Attending: Family Medicine | Admitting: Family Medicine

## 2022-10-18 ENCOUNTER — Ambulatory Visit: Payer: Medicaid Other | Admitting: *Deleted

## 2022-10-18 ENCOUNTER — Ambulatory Visit (INDEPENDENT_AMBULATORY_CARE_PROVIDER_SITE_OTHER): Payer: Medicaid Other | Admitting: Obstetrics and Gynecology

## 2022-10-18 VITALS — BP 110/67 | HR 75

## 2022-10-18 VITALS — BP 106/63 | HR 63 | Wt 146.7 lb

## 2022-10-18 DIAGNOSIS — O0993 Supervision of high risk pregnancy, unspecified, third trimester: Secondary | ICD-10-CM

## 2022-10-18 DIAGNOSIS — O24312 Unspecified pre-existing diabetes mellitus in pregnancy, second trimester: Secondary | ICD-10-CM

## 2022-10-18 DIAGNOSIS — Z603 Acculturation difficulty: Secondary | ICD-10-CM

## 2022-10-18 DIAGNOSIS — O24113 Pre-existing diabetes mellitus, type 2, in pregnancy, third trimester: Secondary | ICD-10-CM

## 2022-10-18 DIAGNOSIS — Z3A37 37 weeks gestation of pregnancy: Secondary | ICD-10-CM

## 2022-10-18 DIAGNOSIS — O0990 Supervision of high risk pregnancy, unspecified, unspecified trimester: Secondary | ICD-10-CM | POA: Insufficient documentation

## 2022-10-18 DIAGNOSIS — O24313 Unspecified pre-existing diabetes mellitus in pregnancy, third trimester: Secondary | ICD-10-CM

## 2022-10-18 DIAGNOSIS — Z758 Other problems related to medical facilities and other health care: Secondary | ICD-10-CM

## 2022-10-18 NOTE — Procedures (Addendum)
Jenasia Brozek Sep 08, 1995 [redacted]w[redacted]d  Fetus A Non-Stress Test Interpretation for 10/18/22  Indication: Diabetes  Fetal Heart Rate A Mode: External Baseline Rate (A): 135 bpm Variability: Moderate Accelerations: 15 x 15 Decelerations: None Multiple birth?: No  Uterine Activity Mode: Toco Contraction Frequency (min): 2.5-6 Contraction Duration (sec): 50-60 Contraction Quality: Mild (not felt by pt) Resting Tone Palpated: Relaxed Resting Time: Adequate  Interpretation (Fetal Testing) Nonstress Test Interpretation: Reactive Overall Impression: Reassuring for gestational age Comments: Tracing reviewed by Dr. Darra Lis

## 2022-10-18 NOTE — Progress Notes (Signed)
    PRENATAL VISIT NOTE  Subjective:  Regina Hayes is a 27 y.o. G2P1001 at [redacted]w[redacted]d being seen today for ongoing prenatal care.  She is currently monitored for the following issues for this high-risk pregnancy and has Supervision of high risk pregnancy, antepartum; Language barrier affecting health care; Pre-existing diabetes mellitus affecting pregnancy in second trimester, antepartum; UTI (urinary tract infection) during pregnancy; and Constipation during pregnancy in second trimester on their problem list.  Patient doing well with no acute concerns today. She reports no complaints.  Contractions: Irritability. Vag. Bleeding: None.  Movement: Present. Denies leaking of fluid.   The following portions of the patient's history were reviewed and updated as appropriate: allergies, current medications, past family history, past medical history, past social history, past surgical history and problem list. Problem list updated.  Objective:   Vitals:   10/18/22 1632  BP: 106/63  Pulse: 63  Weight: 146 lb 11.2 oz (66.5 kg)    Fetal Status: Fetal Heart Rate (bpm): 147 Fundal Height: 39 cm Movement: Present     General:  Alert, oriented and cooperative. Patient is in no acute distress.  Skin: Skin is warm and dry. No rash noted.   Cardiovascular: Normal heart rate noted  Respiratory: Normal respiratory effort, no problems with respiration noted  Abdomen: Soft, gravid, appropriate for gestational age.  Pain/Pressure: Present     Pelvic: Cervical exam deferred        Extremities: Normal range of motion.  Edema: Trace  Mental Status:  Normal mood and affect. Normal behavior. Normal judgment and thought content.   Assessment and Plan:  Pregnancy: G2P1001 at [redacted]w[redacted]d  1. Supervision of high risk pregnancy, antepartum IOL scheduled for Saturday - GC/Chlamydia probe amp ([place])not at [facility] - Culture, beta strep (group b only)  2. [redacted] weeks gestation of pregnancy   3. Pre-existing diabetes  mellitus affecting pregnancy in second trimester, antepartum FBS: 68-118 PPBS: 75-198 EFW 6 lbs 13 oz, 62%  4. Language barrier affecting health care Live interpreter present  Term labor symptoms and general obstetric precautions including but not limited to vaginal bleeding, contractions, leaking of fluid and fetal movement were reviewed in detail with the patient.  Please refer to After Visit Summary for other counseling recommendations.   No follow-ups on file.   Mariel Aloe, MD Faculty Attending Center for [facility]

## 2022-10-19 ENCOUNTER — Telehealth (HOSPITAL_COMMUNITY): Payer: Self-pay | Admitting: *Deleted

## 2022-10-19 ENCOUNTER — Encounter (HOSPITAL_COMMUNITY): Payer: Self-pay

## 2022-10-19 ENCOUNTER — Encounter (HOSPITAL_COMMUNITY): Payer: Self-pay | Admitting: *Deleted

## 2022-10-19 LAB — GC/CHLAMYDIA PROBE AMP (~~LOC~~) NOT AT ARMC
Chlamydia: NEGATIVE
Comment: NEGATIVE
Comment: NORMAL
Neisseria Gonorrhea: NEGATIVE

## 2022-10-19 NOTE — Telephone Encounter (Signed)
Preadmission screen interpreter number [phone]

## 2022-10-20 ENCOUNTER — Encounter (HOSPITAL_COMMUNITY): Payer: Self-pay | Admitting: Obstetrics and Gynecology

## 2022-10-20 ENCOUNTER — Inpatient Hospital Stay (HOSPITAL_COMMUNITY)
Admission: AD | Admit: 2022-10-20 | Discharge: 2022-10-22 | DRG: 807 | Disposition: A | Discharge status: Home / Self Care | Payer: Medicaid Other | Attending: Obstetrics & Gynecology | Admitting: Obstetrics & Gynecology

## 2022-10-20 ENCOUNTER — Inpatient Hospital Stay (HOSPITAL_COMMUNITY): Payer: Medicaid Other

## 2022-10-20 ENCOUNTER — Other Ambulatory Visit: Payer: Self-pay

## 2022-10-20 DIAGNOSIS — E119 Type 2 diabetes mellitus without complications: Secondary | ICD-10-CM | POA: Diagnosis present

## 2022-10-20 DIAGNOSIS — E11649 Type 2 diabetes mellitus with hypoglycemia without coma: Secondary | ICD-10-CM | POA: Diagnosis present

## 2022-10-20 DIAGNOSIS — Z603 Acculturation difficulty: Secondary | ICD-10-CM

## 2022-10-20 DIAGNOSIS — O26893 Other specified pregnancy related conditions, third trimester: Secondary | ICD-10-CM | POA: Diagnosis present

## 2022-10-20 DIAGNOSIS — O24313 Unspecified pre-existing diabetes mellitus in pregnancy, third trimester: Secondary | ICD-10-CM

## 2022-10-20 DIAGNOSIS — Z3A38 38 weeks gestation of pregnancy: Secondary | ICD-10-CM

## 2022-10-20 DIAGNOSIS — Z7982 Long term (current) use of aspirin: Secondary | ICD-10-CM | POA: Diagnosis not present

## 2022-10-20 DIAGNOSIS — O2412 Pre-existing diabetes mellitus, type 2, in childbirth: Principal | ICD-10-CM | POA: Diagnosis present

## 2022-10-20 DIAGNOSIS — Z30017 Encounter for initial prescription of implantable subdermal contraceptive: Secondary | ICD-10-CM | POA: Diagnosis not present

## 2022-10-20 DIAGNOSIS — Z794 Long term (current) use of insulin: Secondary | ICD-10-CM | POA: Diagnosis not present

## 2022-10-20 DIAGNOSIS — O0990 Supervision of high risk pregnancy, unspecified, unspecified trimester: Secondary | ICD-10-CM

## 2022-10-20 DIAGNOSIS — K5900 Constipation, unspecified: Secondary | ICD-10-CM

## 2022-10-20 DIAGNOSIS — O24312 Unspecified pre-existing diabetes mellitus in pregnancy, second trimester: Secondary | ICD-10-CM | POA: Diagnosis present

## 2022-10-20 DIAGNOSIS — Z758 Other problems related to medical facilities and other health care: Secondary | ICD-10-CM | POA: Diagnosis present

## 2022-10-20 HISTORY — DX: Type 2 diabetes mellitus without complications: E11.9

## 2022-10-20 LAB — CBC
HCT: 37.4 % (ref 36.0–46.0)
Hemoglobin: 13.1 g/dL (ref 12.0–15.0)
MCH: 30.2 pg (ref 26.0–34.0)
MCHC: 35 g/dL (ref 30.0–36.0)
MCV: 86.2 fL (ref 80.0–100.0)
Platelets: 135 10*3/uL — ABNORMAL LOW (ref 150–400)
RBC: 4.34 MIL/uL (ref 3.87–5.11)
RDW: 12.8 % (ref 11.5–15.5)
WBC: 10.1 10*3/uL (ref 4.0–10.5)
nRBC: 0 % (ref 0.0–0.2)

## 2022-10-20 LAB — COMPREHENSIVE METABOLIC PANEL
ALT: 14 U/L (ref 0–44)
AST: 22 U/L (ref 15–41)
Albumin: 2.9 g/dL — ABNORMAL LOW (ref 3.5–5.0)
Alkaline Phosphatase: 115 U/L (ref 38–126)
Anion gap: 8 (ref 5–15)
BUN: 5 mg/dL — ABNORMAL LOW (ref 6–20)
CO2: 22 mmol/L (ref 22–32)
Calcium: 8.4 mg/dL — ABNORMAL LOW (ref 8.9–10.3)
Chloride: 106 mmol/L (ref 98–111)
Creatinine, Ser: 0.51 mg/dL (ref 0.44–1.00)
GFR, Estimated: >60 mL/min (ref >60)
Glucose, Bld: 71 mg/dL (ref 70–99)
Potassium: 3.5 mmol/L (ref 3.5–5.1)
Sodium: 136 mmol/L (ref 135–145)
Total Bilirubin: 0.5 mg/dL (ref 0.3–1.2)
Total Protein: 6.4 g/dL — ABNORMAL LOW (ref 6.5–8.1)

## 2022-10-20 LAB — GROUP B STREP BY PCR: Group B strep by PCR: NEGATIVE

## 2022-10-20 LAB — GLUCOSE, CAPILLARY
Glucose-Capillary: 64 mg/dL — ABNORMAL LOW (ref 70–99)
Glucose-Capillary: 76 mg/dL (ref 70–99)
Glucose-Capillary: 52 mg/dL — ABNORMAL LOW (ref 70–99)
Glucose-Capillary: 81 mg/dL (ref 70–99)
Glucose-Capillary: 83 mg/dL (ref 70–99)
Glucose-Capillary: 118 mg/dL — ABNORMAL HIGH (ref 70–99)

## 2022-10-20 LAB — TYPE AND SCREEN
ABO/RH(D): O POS
Antibody Screen: NEGATIVE

## 2022-10-20 LAB — RPR: RPR Ser Ql: NONREACTIVE

## 2022-10-20 MED ORDER — OXYTOCIN-SODIUM CHLORIDE 30-0.9 UT/500ML-% IV SOLN
2.5000 [IU]/h | INTRAVENOUS | Status: DC
  Administered 2022-10-20: 2.5 [IU]/h via INTRAVENOUS
  Filled 2022-10-20: qty 500

## 2022-10-20 MED ORDER — SIMETHICONE 80 MG PO CHEW: 80.0000 mg | CHEWABLE_TABLET | ORAL | Status: DC | PRN

## 2022-10-20 MED ORDER — ACETAMINOPHEN 325 MG PO TABS: 650.0000 mg | ORAL_TABLET | ORAL | Status: DC | PRN

## 2022-10-20 MED ORDER — LACTATED RINGERS IV SOLN: INTRAVENOUS | Status: DC

## 2022-10-20 MED ORDER — FENTANYL CITRATE (PF) 100 MCG/2ML IJ SOLN
50.0000 ug | INTRAMUSCULAR | Status: DC | PRN
  Administered 2022-10-20: 100 ug via INTRAVENOUS
  Filled 2022-10-20: qty 2

## 2022-10-20 MED ORDER — DEXTROSE 50 % IV SOLN: 0.0000 mL | INTRAVENOUS | Status: DC | PRN

## 2022-10-20 MED ORDER — INSULIN ASPART 100 UNIT/ML IJ SOLN
0.0000 [IU] | Freq: Three times a day (TID) | INTRAMUSCULAR | Status: DC
  Administered 2022-10-21 – 2022-10-22 (×2): 2 [IU] via SUBCUTANEOUS

## 2022-10-20 MED ORDER — DIPHENHYDRAMINE HCL 25 MG PO CAPS: 25.0000 mg | ORAL_CAPSULE | Freq: Four times a day (QID) | ORAL | Status: DC | PRN

## 2022-10-20 MED ORDER — OXYTOCIN BOLUS FROM INFUSION
333.0000 mL | Freq: Once | INTRAVENOUS | Status: AC
  Administered 2022-10-20: 333 mL via INTRAVENOUS

## 2022-10-20 MED ORDER — ZOLPIDEM TARTRATE 5 MG PO TABS: 5.0000 mg | ORAL_TABLET | Freq: Every evening | ORAL | Status: DC | PRN

## 2022-10-20 MED ORDER — WITCH HAZEL-GLYCERIN EX PADS: 1.0000 | MEDICATED_PAD | CUTANEOUS | Status: DC | PRN

## 2022-10-20 MED ORDER — DEXTROSE IN LACTATED RINGERS 5 % IV SOLN: INTRAVENOUS | Status: DC

## 2022-10-20 MED ORDER — OXYCODONE-ACETAMINOPHEN 5-325 MG PO TABS: 1.0000 | ORAL_TABLET | ORAL | Status: DC | PRN

## 2022-10-20 MED ORDER — DIBUCAINE (PERIANAL) 1 % EX OINT: 1.0000 | TOPICAL_OINTMENT | CUTANEOUS | Status: DC | PRN

## 2022-10-20 MED ORDER — SOD CITRATE-CITRIC ACID 500-334 MG/5ML PO SOLN: 30.0000 mL | ORAL | Status: DC | PRN

## 2022-10-20 MED ORDER — ONDANSETRON HCL 4 MG/2ML IJ SOLN: 4.0000 mg | INTRAMUSCULAR | Status: DC | PRN

## 2022-10-20 MED ORDER — INSULIN REGULAR(HUMAN) IN NACL 100-0.9 UT/100ML-% IV SOLN
INTRAVENOUS | Status: DC
  Filled 2022-10-20: qty 100

## 2022-10-20 MED ORDER — COCONUT OIL OIL: 1.0000 | TOPICAL_OIL | Status: DC | PRN

## 2022-10-20 MED ORDER — PRENATAL MULTIVITAMIN CH
1.0000 | ORAL_TABLET | Freq: Every day | ORAL | Status: DC
  Administered 2022-10-21 – 2022-10-22 (×2): 1 via ORAL
  Filled 2022-10-20 (×2): qty 1

## 2022-10-20 MED ORDER — TETANUS-DIPHTH-ACELL PERTUSSIS 5-2.5-18.5 LF-MCG/0.5 IM SUSY: 0.5000 mL | PREFILLED_SYRINGE | Freq: Once | INTRAMUSCULAR | Status: DC

## 2022-10-20 MED ORDER — BENZOCAINE-MENTHOL 20-0.5 % EX AERO
1.0000 | INHALATION_SPRAY | CUTANEOUS | Status: DC | PRN
  Administered 2022-10-22: 1 via TOPICAL
  Filled 2022-10-20: qty 56

## 2022-10-20 MED ORDER — ONDANSETRON HCL 4 MG/2ML IJ SOLN: 4.0000 mg | Freq: Four times a day (QID) | INTRAMUSCULAR | Status: DC | PRN

## 2022-10-20 MED ORDER — TERBUTALINE SULFATE 1 MG/ML IJ SOLN: 0.2500 mg | Freq: Once | INTRAMUSCULAR | Status: DC | PRN

## 2022-10-20 MED ORDER — OXYTOCIN-SODIUM CHLORIDE 30-0.9 UT/500ML-% IV SOLN
1.0000 m[IU]/min | INTRAVENOUS | Status: DC
  Administered 2022-10-20: 2 m[IU]/min via INTRAVENOUS
  Filled 2022-10-20: qty 500

## 2022-10-20 MED ORDER — LACTATED RINGERS IV SOLN: 500.0000 mL | INTRAVENOUS | Status: DC | PRN

## 2022-10-20 MED ORDER — ONDANSETRON HCL 4 MG PO TABS: 4.0000 mg | ORAL_TABLET | ORAL | Status: DC | PRN

## 2022-10-20 MED ORDER — IBUPROFEN 600 MG PO TABS
600.0000 mg | ORAL_TABLET | Freq: Four times a day (QID) | ORAL | Status: DC
  Administered 2022-10-21 – 2022-10-22 (×3): 600 mg via ORAL
  Filled 2022-10-20 (×6): qty 1

## 2022-10-20 MED ORDER — MISOPROSTOL 25 MCG QUARTER TABLET
25.0000 ug | ORAL_TABLET | Freq: Once | ORAL | Status: AC
  Administered 2022-10-20: 25 ug via VAGINAL
  Filled 2022-10-20: qty 1

## 2022-10-20 MED ORDER — LIDOCAINE HCL (PF) 1 % IJ SOLN
30.0000 mL | INTRAMUSCULAR | Status: AC | PRN
  Administered 2022-10-20: 30 mL via SUBCUTANEOUS
  Filled 2022-10-20: qty 30

## 2022-10-20 MED ORDER — OXYTOCIN-SODIUM CHLORIDE 30-0.9 UT/500ML-% IV SOLN: 1.0000 m[IU]/min | INTRAVENOUS | Status: DC

## 2022-10-20 MED ORDER — SENNOSIDES-DOCUSATE SODIUM 8.6-50 MG PO TABS
2.0000 | ORAL_TABLET | ORAL | Status: DC
  Administered 2022-10-21 – 2022-10-22 (×2): 2 via ORAL
  Filled 2022-10-20 (×2): qty 2

## 2022-10-20 MED ORDER — MISOPROSTOL 50MCG HALF TABLET
50.0000 ug | ORAL_TABLET | Freq: Once | ORAL | Status: AC
  Administered 2022-10-20: 50 ug via ORAL
  Filled 2022-10-20: qty 1

## 2022-10-20 NOTE — Progress Notes (Addendum)
Labor Progress Note Regina Hayes is a 27 y.o. G2P1001 at [redacted]w[redacted]d presented for IOL due to preexisting T2GDM class B.  S: Patient appears uncomfortable. AROM and epidural discussed with patient. Patient agreed to AROM. Patient requested IV pain medication and deferred epidural to be used as "last resort."  O:  BP 137/86   Pulse 68   Temp 97.9 F (36.6 C) (Oral)   Resp 16   Ht 5\' 3"  (1.6 m)   Wt 66.9 kg   LMP 01/27/2022   BMI 26.13 kg/m  EFM: 135 bpm/ Moderate Variability/ + Accels / None decels  CVE: Dilation: 6 Effacement (%): 80 Station: -2 Presentation: Vertex Exam by:: V Rosaland Shiffman  A&P: 27 y.o. G2P1001 [redacted]w[redacted]d IOL due to preexisting T2GDM class B  #Labor: Progressing well. On pit 2 mu/min. Contracting about every 2 minutes. SVE performed. AROM with clear fluid.  #Pain: Family/friend support. IV fentanyl given after SVE and prior to AROM. Not planning epidural at this time.  #FWB: Cat 1 #GBS negative #Preexisting T2GDM Class B: q2 glucose monitoring. Most recent CBG: 68  Bubba Hales, Medical Student 4:35 PM   Fellow  Attestation  I saw and evaluated the patient, performing the key elements of the service.I  personally performed or re-performed the history, physical exam, and medical decision making activities of this service and have verified that the service and findings are accurately documented in the student's note. I developed the management plan that is described in the medical student's note, and I agree with the content, with my edits above.    Derrel Nip, MD

## 2022-10-20 NOTE — Discharge Summary (Signed)
Postpartum Discharge Summary  Date of Service updated***     Patient Name: Regina Hayes DOB: Nov 09, 1995 MRN: [id number]  Date of admission: 10/20/2022 Delivery date:10/20/2022  Delivering provider: Celedonio Savage  Date of discharge: 10/20/2022  Admitting diagnosis: Pre-existing diabetes mellitus during pregnancy in third trimester [O24.313] Intrauterine pregnancy: [redacted]w[redacted]d     Secondary diagnosis:  Principal Problem:   Pre-existing diabetes mellitus during pregnancy in third trimester Active Problems:   Supervision of high risk pregnancy, antepartum   Language barrier affecting health care   Pre-existing diabetes mellitus affecting pregnancy in second trimester, antepartum   Vaginal delivery  Additional problems: ***    Discharge diagnosis: Term Pregnancy Delivered and Type 2 DM                                              Post partum procedures:{Postpartum procedures:23558} Augmentation: AROM, Cytotec, and IP Foley Complications: None  Hospital course: Induction of Labor With Vaginal Delivery   27 y.o. yo G2P1001 at [redacted]w[redacted]d was admitted to the hospital 10/20/2022 for induction of labor.  Indication for induction: TYPE 2 DM.  Patient had an labor course complicated by hypoglycemic events requiring treatment.  Membrane Rupture Time/Date: 4:14 PM ,10/20/2022   Delivery Method:Vaginal, Spontaneous  Episiotomy: None  Lacerations:  2nd degree;Perineal  Details of delivery can be found in separate delivery note.  Patient had a postpartum course complicated by***. Patient is discharged home 10/20/22.  Newborn Data: Birth date:10/20/2022  Birth time:5:31 PM  Gender:Female  Living status:Living  Apgars:9 ,9  Weight:3570 g   Magnesium Sulfate received: No BMZ received: No Rhophylac:N/A MMR:No T-DaP:Given prenatally Flu: N/A Transfusion:{Transfusion received:30440034}  Physical exam  Vitals:   10/20/22 1800 10/20/22 1815 10/20/22 1830 10/20/22 1846  BP: 114/77 109/72 112/72  115/78  Pulse: 69 69 88 70  Resp: 20 19 17 20   Temp: 97.8 F (36.6 C)     TempSrc: Oral     Weight:      Height:       General: {Exam; general:21111117} Lochia: {Desc; appropriate/inappropriate:30686::"appropriate"} Uterine Fundus: {Desc; firm/soft:30687} Incision: {Exam; incision:21111123} DVT Evaluation: {Exam; dvt:2111122} Labs: Lab Results  Component Value Date   WBC 10.1 10/20/2022   HGB 13.1 10/20/2022   HCT 37.4 10/20/2022   MCV 86.2 10/20/2022   PLT 135 (L) 10/20/2022      Latest Ref Rng & Units 10/20/2022    8:11 AM  CMP  Glucose 70 - 99 mg/dL 71   BUN 6 - 20 mg/dL 5   Creatinine 4.09 - 8.11 mg/dL 9.14   Sodium 782 - 956 mmol/L 136   Potassium 3.5 - 5.1 mmol/L 3.5   Chloride 98 - 111 mmol/L 106   CO2 22 - 32 mmol/L 22   Calcium 8.9 - 10.3 mg/dL 8.4   Total Protein 6.5 - 8.1 g/dL 6.4   Total Bilirubin 0.3 - 1.2 mg/dL 0.5   Alkaline Phos 38 - 126 U/L 115   AST 15 - 41 U/L 22   ALT 0 - 44 U/L 14    Edinburgh Score:    08/04/2019    1:43 PM  Edinburgh Postnatal Depression Scale Screening Tool  I have been able to laugh and see the funny side of things. 0  I have looked forward with enjoyment to things. 0  I have blamed myself unnecessarily when things went wrong.  0  I have been anxious or worried for no good reason. 0  I have felt scared or panicky for no good reason. 0  Things have been getting on top of me. 0  I have been so unhappy that I have had difficulty sleeping. 0  I have felt sad or miserable. 0  I have been so unhappy that I have been crying. 0  The thought of harming myself has occurred to me. 0  Edinburgh Postnatal Depression Scale Total 0     After visit meds:  Allergies as of 10/20/2022   No Known Allergies   Med Rec must be completed prior to using this [facility]***        Discharge home in stable condition Infant Feeding: {Baby feeding:23562} Infant Disposition:{CHL IP OB HOME WITH ZOXWRU:04540} Discharge instruction: per  After Visit Summary and Postpartum booklet. Activity: Advance as tolerated. Pelvic rest for 6 weeks.  Diet: {OB JWJX:[id number]} Future Appointments:No future appointments. Follow up Visit: Message sent   Please schedule this patient for a In person postpartum visit in 6 weeks with the following provider: Any provider. Additional Postpartum F/U:2 hour GTT  High risk pregnancy complicated by: GDM Delivery mode:  Vaginal, Spontaneous  Anticipated Birth Control:  PP Nexplanon placed   10/20/2022 Celedonio Savage, MD

## 2022-10-20 NOTE — Progress Notes (Signed)
4 oz of Apple juiice given po

## 2022-10-20 NOTE — Plan of Care (Signed)
Pt demonstrated understanding 

## 2022-10-20 NOTE — H&P (Signed)
OBSTETRIC ADMISSION HISTORY AND PHYSICAL  Regina Hayes is a 27 y.o. female G2P1001 with IUP at [redacted]w[redacted]d by LMP presenting for IOL. She reports +FMs, No LOF, no VB, no blurry vision, headaches or peripheral edema, and RUQ pain.  She plans on bottle feeding. She request nexplanon for birth control. She received her prenatal care at [facility]   Dating: By LMP --->  Estimated Date of Delivery: 11/03/22  Sono: @[redacted]w[redacted]d , CWD, normal anatomy, cephalic presentation, placenta anterior lie, 3081g, 62% EFW  Prenatal History/Complications:  - Pre-existing TII DM treated with insulin  Nursing Staff Provider  Office Location [facility] Dating    Language  Nepali Anatomy US   Scheduled  Flu Vaccine  04/26/22 Genetic Screen  NIPS: LR  AFP: neg  First Screen:  Horizon neg x 4   TDaP vaccine  09/04/22  Hgb A1C or  GTT  Type 2   Rhogam  NA   LAB RESULTS   Feeding Plan Bottle Blood Type O/Positive/-- (06/29 0000)   Contraception Undecided Antibody Negative (06/29 0000)  Circumcision Girl Rubella   Immune  Pediatrician  List given RPR Nonreactive (06/29 0000)   Support Person Regina Hayes(FOB) HBsAg     Prenatal Classes  HIV  NR 04/22/2019  BTL Consent n/a GBS --[name] (12/31 1135)negative  VBAC Consent  Pap NILM 11/2021    Hgb Electro  N/a  BP Cuff Ordered 05/08/2019 CF N/a    SMA N/a    Waterbirth  [ ]  Class [ ]  Consent [ ]  CNM visit   Past Medical History: Past Medical History:  Diagnosis Date   Diabetes mellitus without complication (HCC)    Gestational diabetes    Medical history non-contributory    S/P appendectomy 12/25/2019   Past Surgical History: Past Surgical History:  Procedure Laterality Date   APPENDECTOMY     Obstetrical History: OB History     Gravida  2   Para  1   Term  1   Preterm      AB      Living  1      SAB      IAB      Ectopic      Multiple  0   Live Births  1         Social History Social History   Socioeconomic History   Marital status: Married     Spouse name: Not on file   Number of children: Not on file   Years of education: Not on file   Highest education level: Not on file  Occupational History   Not on file  Tobacco Use   Smoking status: Never   Smokeless tobacco: Never  Vaping Use   Vaping Use: Never used  Substance and Sexual Activity   Alcohol use: Not Currently   Drug use: Never   Sexual activity: Yes    Birth control/protection: None  Other Topics Concern   Not on file  Social History Narrative   Not on file   Social Determinants of Health   Financial Resource Strain: Not on file  Food Insecurity: No Food Insecurity (06/11/2019)   Hunger Vital Sign    Worried About Running Out of Food in the Last Year: Never true    Ran Out of Food in the Last Year: Never true  Transportation Needs: No Transportation Needs (06/11/2019)   PRAPARE - Administrator, Civil Service (Medical): No    Lack of Transportation (Non-Medical): No  Physical Activity: Not  on file  Stress: Not on file  Social Connections: Not on file   Family History: Family History  Problem Relation Age of Onset   Diabetes Mother    Allergies: No Known Allergies  Medications Prior to Admission  Medication Sig Dispense Refill Last Dose   Accu-Chek FastClix Lancets MISC 1 Lancet by Does not apply route 4 (four) times daily. 100 each 11    aspirin EC 81 MG tablet Take 1 tablet (81 mg total) by mouth daily. Take after 12 weeks for prevention of preeclampsia later in pregnancy 300 tablet 2    Blood Glucose Monitoring Suppl (ACCU-CHEK GUIDE) w/Device KIT 1 Units by Does not apply route 3 (three) times daily. 1 kit 0    Blood Pressure Monitoring DEVI 1 Device by Does not apply route once a week. 1 Device 0    docusate sodium (COLACE) 100 MG capsule Take 1 capsule (100 mg total) by mouth 2 (two) times daily as needed. (Patient not taking: Reported on 09/27/2022) 30 capsule 2    glucose blood test strip Use 4 times daily as instructed 100 each 12     insulin glargine (LANTUS SOLOSTAR) 100 UNIT/ML Solostar Pen Inject 24 Units into the skin 2 (two) times daily. 15 mL 11    insulin lispro (HUMALOG KWIKPEN) 100 UNIT/ML KwikPen Inject 9 Units into the skin 2 (two) times daily with a meal. Inject 4 units two (2) times daily with lunch and supper. (Patient taking differently: Inject 9 Units into the skin 2 (two) times daily with a meal. Inject 9 units  with lunch and 12 units with supper.) 15 mL 11    Insulin Pen Needle (PEN NEEDLES) 32G X 4 MM MISC Use needles to inject insulin as directed 90 each 11    Iron, Ferrous Sulfate, 325 (65 Fe) MG TABS Take 325 mg by mouth daily. 60 tablet 2    prenatal vitamin w/FE, FA (PRENATAL 1 + 1) 27-1 MG TABS tablet Take 1 tablet by mouth daily at 12 noon. 30 tablet 11    Review of Systems   All systems reviewed and negative except as stated in HPI  Height 5\' 3"  (1.6 m), weight 66.9 kg, last menstrual period 01/27/2022, unknown if currently breastfeeding. General appearance: alert, cooperative, and appears stated age Lungs: normal work of breathing  Heart: regular rate  Abdomen: soft, non-tender Pelvic: 1/50/-3 Extremities: Homans sign is negative, no sign of DVT  Presentation: cephalic Fetal monitoringBaseline: 140 bpm, Variability: Good {> 6 bpm), Accelerations: Reactive, and Decelerations: Absent Uterine activityFrequency: Every 2-3 minutes   Prenatal labs: ABO, Rh: O/Positive/-- (11/02 0000) Antibody: Negative (11/02 0000) Rubella: Nonimmune (11/02 0000) RPR: Non Reactive (02/26 0923)  HBsAg: Negative (11/02 0000)  HIV: Non Reactive (02/26 0923)  GBS:   Neg 1 hr Glucola Preexisting T2GDM class B Genetic screening  LR female  Anatomy US Normal  Prenatal Transfer Tool  Maternal Diabetes: Yes:  Diabetes Type:  Pre-pregnancy, Insulin/Medication controlled Genetic Screening: Normal Maternal Ultrasounds/Referrals: Normal Fetal Ultrasounds or other Referrals:  Referred to Materal Fetal Medicine   Maternal Substance Abuse:  No Significant Maternal Medications:  None Significant Maternal Lab Results:  Group B Strep negative Number of Prenatal Visits:greater than 3 verified prenatal visits Other Comments:  None  No results found for this or any previous visit (from the past 24 hour(s)).  Patient Active Problem List   Diagnosis Date Noted   Constipation during pregnancy in second trimester 07/23/2022   UTI (urinary tract infection)  during pregnancy 06/04/2022   Pre-existing diabetes mellitus affecting pregnancy in second trimester, antepartum 05/03/2022   Supervision of high risk pregnancy, antepartum 05/07/2019   Language barrier affecting health care 05/07/2019   Assessment/Plan:  Regina Hayes is a 27 y.o. G2P1001 at [redacted]w[redacted]d here for IOL due to pre-existing type II DM controlled with insulin.  #Labor:Induction started with cytotec and FB. Will reasses once out  #Pain: Per patient request  #FWB: Cat 1 #ID:  GBS neg #MOF: Bottle #MOC:Nexplanon #T2DM: Q4 hour CBGs in latent labor and Q2 in active. Will treat as needed   Bubba Hales, Medical Student  10/20/2022, 7:39 AM

## 2022-10-20 NOTE — Lactation Note (Signed)
This note was copied from a baby's chart. Lactation Consultation Note  Patient Name: Boy Gianna Calef [id number] Date: 10/20/2022 Age:29 hours   Mom chooses to formula feed.  Maternal Data    Feeding Nipple Type: Regular  LATCH Score                    Lactation Tools Discussed/Used    Interventions    Discharge    Consult Status Consult Status: Complete    Maisley Hainsworth G 10/20/2022, 7:57 PM

## 2022-10-20 NOTE — Inpatient Diabetes Management (Signed)
Inpatient Diabetes Program Recommendations  Diabetes Treatment Program Recommendations  ADA Standards of Care Diabetes in Pregnancy Target Glucose Ranges:  Fasting: 70 - 95 mg/dL 1 hr postprandial: Less than 140mg /dL (from first bite of meal) 2 hr postprandial: Less than 120 mg/dL (from first bite of meal)     04/26/22 00:00  Hemoglobin-A1c 6.7 (E)  (E): External lab result  Review of Glycemic Control  Diabetes history: DM2 hx; 38W Outpatient Diabetes medications: Lantus 24 units BID, Humalog 9 units with lunch and 12 units with supper; prior to pregnancy Glipizide 5 mg BID (did not tolerate Metformin) Current orders for Inpatient glycemic control: IV insulin  Inpatient Diabetes Program Recommendations:    Insulin: Patient is ordered IV insulin. If CBG over 120 mg/dl, please use IV insulin as currently ordered.   NOTE: Noted consult for Diabetes Coordinator. Diabetes Coordinator is not on campus over the weekend but available by pager from 8am to 5pm for questions or concerns. Per chart review, patient admitted today for IOL and ordered IV insulin. No CBGs or labs in chart at this time. Sent chat message to Clois Dupes, RN to inquire about current CBG. Prior to pregnancy patient had been prescribed Metformin but patient was not taking it due to nausea; was started on Glipizide 5 mg BID on 12/20/21; changed from Glipizide to Glyburide 5 mg BID on 03/30/22 due to being pregnant; told to discontinue Glyburide on 04/05/22 due to hypoglycemia; started on Lantus and Humalog insulin on 05/24/22. Inpatient diabetes coordinator will follow along.  Thanks, Orlando Penner, RN, MSN, CDCES Diabetes Coordinator Inpatient Diabetes Program [phone] (Team Pager from 8am to 5pm)

## 2022-10-21 LAB — GLUCOSE, CAPILLARY
Glucose-Capillary: 56 mg/dL — ABNORMAL LOW (ref 70–99)
Glucose-Capillary: 74 mg/dL (ref 70–99)
Glucose-Capillary: 117 mg/dL — ABNORMAL HIGH (ref 70–99)
Glucose-Capillary: 51 mg/dL — ABNORMAL LOW (ref 70–99)
Glucose-Capillary: 147 mg/dL — ABNORMAL HIGH (ref 70–99)
Glucose-Capillary: 99 mg/dL (ref 70–99)

## 2022-10-21 NOTE — Progress Notes (Signed)
Post Partum Day 1, s/p vaginal birth 10/20/2022 at 1731  Subjective: "Body pain". Declining pain medication, ambulating in room  Objective: Blood pressure 102/79, pulse 74, temperature 98 F (36.7 C), temperature source Oral, resp. rate 17, height 5\' 3"  (1.6 m), weight 66.9 kg, last menstrual period 01/27/2022, SpO2 100 %, unknown if currently breastfeeding.  Physical Exam:  General: alert, cooperative, appears stated age, and no distress Lochia: appropriate Uterine Fundus: firm Incision: N/A DVT Evaluation: No evidence of DVT seen on physical exam.  Recent Labs    10/20/22 0811  HGB 13.1  HCT 37.4    Assessment/Plan: --Type 2DM, stable --Recovering as expected --Language barrier: patient has signed consent to have her husband serve as interpreter --Plan for discharge tomorrow   LOS: 1 day   Calvert Cantor, CNM 10/21/2022, 10:54 AM

## 2022-10-22 ENCOUNTER — Encounter (HOSPITAL_COMMUNITY): Payer: Self-pay | Admitting: Obstetrics and Gynecology

## 2022-10-22 ENCOUNTER — Other Ambulatory Visit (HOSPITAL_COMMUNITY): Payer: Self-pay

## 2022-10-22 ENCOUNTER — Encounter: Payer: Self-pay | Admitting: Family Medicine

## 2022-10-22 DIAGNOSIS — Z30017 Encounter for initial prescription of implantable subdermal contraceptive: Secondary | ICD-10-CM

## 2022-10-22 LAB — GLUCOSE, CAPILLARY
Glucose-Capillary: 61 mg/dL — ABNORMAL LOW (ref 70–99)
Glucose-Capillary: 92 mg/dL (ref 70–99)
Glucose-Capillary: 122 mg/dL — ABNORMAL HIGH (ref 70–99)

## 2022-10-22 LAB — CULTURE, BETA STREP (GROUP B ONLY): Strep Gp B Culture: NEGATIVE

## 2022-10-22 MED ORDER — SENNOSIDES-DOCUSATE SODIUM 8.6-50 MG PO TABS
2.0000 | ORAL_TABLET | ORAL | 0 refills | Status: DC
  Filled 2022-10-22: qty 10, 5d supply, fill #0

## 2022-10-22 MED ORDER — METFORMIN HCL 500 MG PO TABS: 500.0000 mg | ORAL_TABLET | Freq: Every day | ORAL | Status: DC

## 2022-10-22 MED ORDER — ETONOGESTREL 68 MG ~~LOC~~ IMPL
68.0000 mg | DRUG_IMPLANT | Freq: Once | SUBCUTANEOUS | Status: AC
  Administered 2022-10-22: 68 mg via SUBCUTANEOUS
  Filled 2022-10-22: qty 1

## 2022-10-22 MED ORDER — LIDOCAINE HCL 1 % IJ SOLN
0.0000 mL | Freq: Once | INTRAMUSCULAR | Status: AC | PRN
  Administered 2022-10-22: 20 mL via INTRADERMAL
  Filled 2022-10-22: qty 20

## 2022-10-22 MED ORDER — BENZOCAINE-MENTHOL 20-0.5 % EX AERO
1.0000 | INHALATION_SPRAY | CUTANEOUS | 0 refills | Status: DC | PRN
  Filled 2022-10-22: qty 56, fill #0

## 2022-10-22 MED ORDER — METFORMIN HCL 500 MG PO TABS
500.0000 mg | ORAL_TABLET | Freq: Every day | ORAL | 3 refills | Status: DC
  Filled 2022-10-22: qty 60, 60d supply, fill #0

## 2022-10-22 MED ORDER — ACETAMINOPHEN 325 MG PO TABS
650.0000 mg | ORAL_TABLET | ORAL | 0 refills | Status: DC | PRN
  Filled 2022-10-22: qty 30, 3d supply, fill #0

## 2022-10-22 MED ORDER — WITCH HAZEL-GLYCERIN EX PADS
1.0000 | MEDICATED_PAD | CUTANEOUS | 12 refills | Status: DC | PRN
  Filled 2022-10-22: qty 40, 20d supply, fill #0

## 2022-10-22 NOTE — Progress Notes (Signed)
GYNECOLOGY PROCEDURE NOTE  Regina Hayes is a 27 y.o. [id number] requesting Nexplanon insertion. No gynecologic concerns.  Nexplanon Insertion Procedure Patient identified, informed consent performed, consent signed. Patient does understand that irregular bleeding is a very common side effect of this medication. She was advised to have backup contraception for one week after placement. Appropriate time out taken. Patient's Left arm was prepped and draped in the usual sterile fashion. The insertion area was measured and marked. Patient was prepped with alcohol swab and then injected with 3 ml of 1% lidocaine. The area was then prepped with betadine. Nexplanon removed from packaging and device confirmed present within needle, then inserted full length of needle and withdrawn per handbook instructions. Nexplanon was able to palpated in the patient's arm; patient palpated the insert herself. There was minimal blood loss. Patient insertion site covered with steri strip, guaze, and a pressure bandage to reduce any bruising. The patient tolerated the procedure well and was given post procedure instructions.   Exp: 2025-11 Lot # [id number]  Nahdia Doucet Danella Deis) Suzie Portela, MSN, CNM  Center for [facility]  10/22/22 1:14 PM

## 2022-10-22 NOTE — Progress Notes (Signed)
Patient screened out for psychosocial assessment since none of the following apply:  Psychosocial stressors documented in mother or baby's chart  Gestation less than 32 weeks  Code at delivery   Infant with anomalies Please contact the Clinical Social Worker if specific needs arise, by MOB's request, or if MOB scores greater than 9/yes to question 10 on Edinburgh Postpartum Depression Screen.  Aidon Klemens, LCSW Clinical Social Worker [facility] Cell#: [phone]     

## 2022-10-22 NOTE — Progress Notes (Addendum)
Post Partum Day 2 Subjective: no complaints, up ad lib, voiding, and tolerating PO  Objective: Blood pressure 103/67, pulse 60, temperature 98.1 F (36.7 C), temperature source Oral, resp. rate 18, height 5\' 3"  (1.6 m), weight 66.9 kg, last menstrual period 01/27/2022, SpO2 99 %, unknown if currently breastfeeding.  Physical Exam:  General: alert, cooperative, and appears stated age Lochia: appropriate Uterine Fundus: firm Incision: N/A  DVT Evaluation: No evidence of DVT seen on physical exam.  Recent Labs    10/20/22 0811  HGB 13.1  HCT 37.4    Assessment/Plan: Discharge home today. Insertion of Nexplanon done prior to discharge. Baby to NICU for blood sugars. Patient started on Metrofin 500mg  Daily for postpartum. Rx sent to [facility] prior to discharge.    LOS: 2 days   Claudette Head, CNM 10/22/2022, 1:11 PM

## 2022-10-31 ENCOUNTER — Telehealth (HOSPITAL_COMMUNITY): Payer: Self-pay

## 2022-10-31 NOTE — Telephone Encounter (Signed)
Patient reports feeling good. Patient has questions about when her postpartum appointment is. RN reviewed appointment information with patient. Patient declines any other questions/concerns about her health and healing.  Patient reports that baby is doing good. Eating, peeing/pooping, and gaining weight well. Baby sleeps in a crib. RN reviewed ABC's of safe sleep with patient. Patient declines any questions or concerns about baby.  EPDS score is 0.  Suann Larry [facility]  Perinatal Services   10/31/22,1927

## 2022-12-06 ENCOUNTER — Ambulatory Visit (INDEPENDENT_AMBULATORY_CARE_PROVIDER_SITE_OTHER): Payer: Medicaid Other | Admitting: Advanced Practice Midwife

## 2022-12-06 VITALS — BP 107/74 | HR 73 | Ht 63.0 in | Wt 124.0 lb

## 2022-12-06 DIAGNOSIS — B86 Scabies: Secondary | ICD-10-CM | POA: Diagnosis not present

## 2022-12-06 DIAGNOSIS — L282 Other prurigo: Secondary | ICD-10-CM

## 2022-12-06 MED ORDER — PERMETHRIN 5 % EX CREA
1.0000 | TOPICAL_CREAM | CUTANEOUS | 0 refills | Status: AC
Start: 2022-12-06 — End: 2022-12-14

## 2022-12-06 NOTE — Progress Notes (Signed)
Post Partum Visit Note  Regina Hayes is a 27 y.o. [id number] female who presents for a postpartum visit. She is 6 weeks postpartum following a normal spontaneous vaginal delivery.  I have fully reviewed the prenatal and intrapartum course. The delivery was at 38 gestational weeks.  Anesthesia: none. Postpartum course has been uncomplicated. Baby is doing well. Baby is feeding by bottle - Similac Advance . Bleeding thin lochia. Bowel function is normal. Bladder function is normal. Patient is not sexually active. Contraception method is Nexplanon. Postpartum depression screening: negative. C/o extremely pruritic rash on arms/ hands/ legs/buttocks and it itches.states it started over 2 weeks ago. Hasn't tried anything for rash. Denies new medicines, changes in soap or detergent. Initially denies household members having similar Sx, but then reported baby has a rash.   The pregnancy intention screening data noted above was reviewed. Potential methods of contraception were discussed. The patient elected to proceed with Nexplanon placed IP.   Health Maintenance Due  Topic Date Due   COVID-19 Vaccine (1) Never done   FOOT EXAM  Never done   OPHTHALMOLOGY EXAM  Never done   HEMOGLOBIN A1C  10/25/2022    The following portions of the patient's history were reviewed and updated as appropriate: allergies, current medications, past family history, past medical history, past social history, past surgical history, and problem list.  Review of Systems Pertinent items are noted in HPI.  Objective:  BP 107/74   Pulse 73   Ht 5\' 3"  (1.6 m)   Wt 124 lb (56.2 kg)   Breastfeeding No   BMI 21.97 kg/m    General:  alert, cooperative, appears stated age, and no distress  Skin Scatter flat slightly discolored lesions on palms, arms, legs    Breasts:  not indicated  Lungs: Nml effort  Heart:  Nml rate  Abdomen: soft, non-tender; bowel sounds normal; no masses,  no organomegaly   Wound NA  GU exam:   not indicated       Assessment:   1. Pruritic rash  - permethrin (ELIMITE) 5 % cream; Apply 1 Application topically once a week for 2 doses.  Dispense: 60 g; Refill: 0  2. Postpartum care and examination   3. Scabies  - permethrin (ELIMITE) 5 % cream; Apply 1 Application topically once a week for 2 doses.  Dispense: 60 g; Refill: 0   Plan:   Essential components of care per ACOG recommendations:  1.  Mood and well being: Patient with negative depression screening today. Reviewed local resources for support.  - Patient tobacco use? No.   - hx of drug use? No.    2. Infant care and feeding:  -Patient currently breastmilk feeding? No.  -Social determinants of health (SDOH) reviewed in EPIC. No concerns.  3. Sexuality, contraception and birth spacing - Patient does not want a pregnancy in the next year.  Desired family size is 3 children.  - Reviewed reproductive life planning. Reviewed contraceptive methods based on pt preferences and effectiveness.  Patient desired Hormonal Implant placed IP   - Discussed birth spacing of 18 months  4. Sleep and fatigue -Encouraged family/partner/community support of 4 hrs of uninterrupted sleep to help with mood and fatigue  5. Physical Recovery  - Discussed patients delivery and complications. She describes her labor as good. - Patient had a Vaginal, no problems at delivery. Patient had a 2nd degree laceration. Perineal healing reviewed. Patient expressed understanding - Patient has urinary incontinence? Yes. Discussed role of pelvic  floor PT. Offered PT and patient declined.  - Patient is safe to resume physical and sexual activity  6.  Health Maintenance - HM due items addressed No - NA - Last pap smear  Diagnosis  Date Value Ref Range Status  12/20/2021   Final   - Negative for intraepithelial lesion or malignancy (NILM)   Pap smear not done at today's visit.  -Breast Cancer screening indicated? No.   7. Chronic  Disease/Pregnancy Condition follow up:  Type 2 Diabetes  - PCP follow up in 2-3 months  [place], CNM Center for [employer], [facility]

## 2022-12-07 ENCOUNTER — Encounter: Payer: Self-pay | Admitting: Advanced Practice Midwife

## 2022-12-17 ENCOUNTER — Other Ambulatory Visit (HOSPITAL_COMMUNITY): Payer: Self-pay

## 2023-01-23 ENCOUNTER — Ambulatory Visit: Payer: Medicaid Other | Attending: Family Medicine | Admitting: Family Medicine

## 2023-01-23 ENCOUNTER — Telehealth: Payer: Self-pay | Admitting: Family Medicine

## 2023-01-23 ENCOUNTER — Encounter: Payer: Self-pay | Admitting: Family Medicine

## 2023-01-23 VITALS — BP 106/70 | HR 65 | Ht 63.0 in | Wt 119.6 lb

## 2023-01-23 DIAGNOSIS — Z7984 Long term (current) use of oral hypoglycemic drugs: Secondary | ICD-10-CM | POA: Diagnosis not present

## 2023-01-23 DIAGNOSIS — E119 Type 2 diabetes mellitus without complications: Secondary | ICD-10-CM | POA: Diagnosis not present

## 2023-01-23 LAB — POCT GLYCOSYLATED HEMOGLOBIN (HGB A1C): HbA1c, POC (controlled diabetic range): 7.7 % — AB (ref 0.0–7.0)

## 2023-01-23 MED ORDER — METFORMIN HCL 500 MG PO TABS: 1000.0000 mg | ORAL_TABLET | Freq: Two times a day (BID) | ORAL | 3 refills | Status: DC

## 2023-01-23 NOTE — Patient Instructions (Signed)

## 2023-01-23 NOTE — Telephone Encounter (Signed)
Can you please send prescription for CGM to her pharmacy per insurance coverage?  I informed her with that being only on an oral medication I am unsure of insurance coverage.  Thank you.

## 2023-01-23 NOTE — Progress Notes (Signed)
Subjective:  Patient ID: Regina Hayes, female    DOB: 01/31/1996  Age: 27 y.o. MRN: [id number]  CC: Diabetes   HPI Regina Hayes is a 27 y.o. year old female with a history of Type 2 DM (A1c 7.7 )  Interval History: Discussed the use of AI scribe software for clinical note transcription with the patient, who gave verbal consent to proceed.  She presents for a routine follow-up three months postpartum. She reports not checking her blood sugar at home since the pregnancy, but expresses interest in a continuous glucose monitor. She denies any current concerns or symptoms. She also reports occasional numbness in her feet when sitting for prolonged periods knees bent, but denies any pain.  She endorses adherence with metformin. She has not been adherent with a diabetic diet.       Past Medical History:  Diagnosis Date   Diabetes mellitus without complication (HCC)    Gestational diabetes    Medical history non-contributory    S/P appendectomy 12/25/2019   Type 2 diabetes mellitus (HCC) 10/20/2022    Past Surgical History:  Procedure Laterality Date   APPENDECTOMY      Family History  Problem Relation Age of Onset   Diabetes Mother     Social History   Socioeconomic History   Marital status: Married    Spouse name: Not on file   Number of children: Not on file   Years of education: Not on file   Highest education level: Not on file  Occupational History   Not on file  Tobacco Use   Smoking status: Never   Smokeless tobacco: Never  Vaping Use   Vaping status: Never Used  Substance and Sexual Activity   Alcohol use: Not Currently   Drug use: Never   Sexual activity: Yes    Birth control/protection: None  Other Topics Concern   Not on file  Social History Narrative   Not on file   Social Determinants of Health   Financial Resource Strain: Not on file  Food Insecurity: No Food Insecurity (06/11/2019)   Hunger Vital Sign    Worried About Running Out of  Food in the Last Year: Never true    Ran Out of Food in the Last Year: Never true  Transportation Needs: No Transportation Needs (06/11/2019)   PRAPARE - Administrator, Civil Service (Medical): No    Lack of Transportation (Non-Medical): No  Physical Activity: Not on file  Stress: Not on file  Social Connections: Not on file    No Known Allergies  Outpatient Medications Prior to Visit  Medication Sig Dispense Refill   metFORMIN (GLUCOPHAGE) 500 MG tablet Take 1 tablet (500 mg total) by mouth daily with breakfast. 60 tablet 3   No facility-administered medications prior to visit.     ROS Review of Systems  Constitutional:  Negative for activity change and appetite change.  HENT:  Negative for sinus pressure and sore throat.   Respiratory:  Negative for chest tightness, shortness of breath and wheezing.   Cardiovascular:  Negative for chest pain and palpitations.  Gastrointestinal:  Negative for abdominal distention, abdominal pain and constipation.  Genitourinary: Negative.   Musculoskeletal: Negative.   Psychiatric/Behavioral:  Negative for behavioral problems and dysphoric mood.     Objective:  BP 106/70   Pulse 65   Ht 5\' 3"  (1.6 m)   Wt 119 lb 9.6 oz (54.3 kg)   SpO2 99%   BMI 21.19 kg/m  01/23/2023    3:27 PM 12/06/2022    1:39 PM 10/22/2022    6:29 AM  BP/Weight  Systolic BP 106 107 103  Diastolic BP 70 74 67  Wt. (Lbs) 119.6 124   BMI 21.19 kg/m2 21.97 kg/m2       Physical Exam Constitutional:      Appearance: She is well-developed.  Cardiovascular:     Rate and Rhythm: Normal rate.     Heart sounds: Normal heart sounds. No murmur heard. Pulmonary:     Effort: Pulmonary effort is normal.     Breath sounds: Normal breath sounds. No wheezing or rales.  Chest:     Chest wall: No tenderness.  Abdominal:     General: Bowel sounds are normal. There is no distension.     Palpations: Abdomen is soft. There is no mass.     Tenderness:  There is no abdominal tenderness.  Musculoskeletal:        General: Normal range of motion.     Right lower leg: No edema.     Left lower leg: No edema.  Neurological:     Mental Status: She is alert and oriented to person, place, and time.  Psychiatric:        Mood and Affect: Mood normal.        Latest Ref Rng & Units 10/20/2022    8:11 AM 05/29/2022   11:29 AM 12/20/2021   11:32 AM  CMP  Glucose 70 - 99 mg/dL 71  53  160   BUN 6 - 20 mg/dL 5  10  11    Creatinine 0.44 - 1.00 mg/dL 1.09  3.23  5.57   Sodium 135 - 145 mmol/L 136  136  138   Potassium 3.5 - 5.1 mmol/L 3.5  3.4  4.3   Chloride 98 - 111 mmol/L 106  101  102   CO2 22 - 32 mmol/L 22  19  21    Calcium 8.9 - 10.3 mg/dL 8.4  9.0  9.3   Total Protein 6.5 - 8.1 g/dL 6.4  7.0  7.8   Total Bilirubin 0.3 - 1.2 mg/dL 0.5  0.3  0.8   Alkaline Phos 38 - 126 U/L 115  50  70   AST 15 - 41 U/L 22  14  19    ALT 0 - 44 U/L 14  7  13      Lipid Panel     Component Value Date/Time   CHOL 152 12/20/2021 1132   TRIG 62 12/20/2021 1132   HDL 45 12/20/2021 1132   LDLCALC 94 12/20/2021 1132    CBC    Component Value Date/Time   WBC 10.1 10/20/2022 0811   RBC 4.34 10/20/2022 0811   HGB 13.1 10/20/2022 0811   HGB 12.4 08/20/2022 0923   HGB 12.5 04/26/2022 0000   HGB 12.5 04/26/2022 0000   HCT 37.4 10/20/2022 0811   HCT 36.2 08/20/2022 0923   HCT 37 04/26/2022 0000   PLT 135 (L) 10/20/2022 0811   PLT 144 (L) 08/20/2022 0923   PLT 173 04/26/2022 0000   MCV 86.2 10/20/2022 0811   MCV 86 08/20/2022 0923   MCH 30.2 10/20/2022 0811   MCHC 35.0 10/20/2022 0811   RDW 12.8 10/20/2022 0811   RDW 13.0 08/20/2022 0923   LYMPHSABS 2.2 12/20/2021 1132   EOSABS 0.4 12/20/2021 1132   BASOSABS 0.1 12/20/2021 1132    Lab Results  Component Value Date   HGBA1C 7.7 (A) 01/23/2023  Assessment & Plan:      Type 2 Diabetes Mellitus A1c increased from 6.7 to 7.7. Patient not currently checking blood sugars at home. Discussed the  importance of regular blood sugar monitoring and dietary modifications. Patient expressed interest in continuous glucose monitoring system, but insurance coverage is uncertain. -Increase Metformin to 2 tablets BID. -Resume home blood sugar monitoring a few times per week. -Send prescription for continuous glucose monitoring system to pharmacy and await insurance approval. -Encourage dietary modifications, specifically reducing intake of rice, bread, and starches, and increasing vegetable intake. -Plan to recheck A1c in 3 months.  Diabetic Eye Exam No known history of diabetic eye exam. -Refer to ophthalmology for diabetic eye exam.  Diabetic Foot Exam No numbness or pain reported. Normal sensation to monofilament testing. -Continue annual foot exams.          Meds ordered this encounter  Medications   metFORMIN (GLUCOPHAGE) 500 MG tablet    Sig: Take 2 tablets (1,000 mg total) by mouth 2 (two) times daily with a meal.    Dispense:  120 tablet    Refill:  3    Dose increase    Follow-up: Return in about 3 months (around 04/25/2023) for Chronic medical conditions.       Hoy Register, MD, FAAFP. [facility] and Wellness [place], [place] [phone]   01/23/2023, 3:49 PM

## 2023-01-24 ENCOUNTER — Other Ambulatory Visit: Payer: Self-pay

## 2023-01-24 MED ORDER — FREESTYLE LIBRE 2 SENSOR MISC: 6 refills | Status: AC

## 2023-01-24 MED ORDER — FREESTYLE LIBRE 2 READER DEVI: 0 refills | Status: AC

## 2023-01-24 NOTE — Addendum Note (Signed)
Addended by: Lois Huxley, Jeannett Senior L on: 01/24/2023 09:15 AM   Modules accepted: Orders

## 2023-01-24 NOTE — Telephone Encounter (Signed)
Yes ma'am, done. 

## 2023-05-02 ENCOUNTER — Ambulatory Visit: Payer: Medicaid Other | Attending: Family Medicine | Admitting: Family Medicine

## 2023-05-02 ENCOUNTER — Encounter: Payer: Self-pay | Admitting: Family Medicine

## 2023-05-02 VITALS — BP 108/71 | HR 82 | Ht 63.0 in | Wt 113.4 lb

## 2023-05-02 DIAGNOSIS — Z7984 Long term (current) use of oral hypoglycemic drugs: Secondary | ICD-10-CM | POA: Diagnosis not present

## 2023-05-02 DIAGNOSIS — R058 Other specified cough: Secondary | ICD-10-CM | POA: Diagnosis not present

## 2023-05-02 DIAGNOSIS — E119 Type 2 diabetes mellitus without complications: Secondary | ICD-10-CM

## 2023-05-02 LAB — POCT GLYCOSYLATED HEMOGLOBIN (HGB A1C): HbA1c, POC (controlled diabetic range): 6.5 % (ref 0.0–7.0)

## 2023-05-02 MED ORDER — METFORMIN HCL 500 MG PO TABS: 1000.0000 mg | ORAL_TABLET | Freq: Two times a day (BID) | ORAL | 1 refills | Status: DC

## 2023-05-02 MED ORDER — CETIRIZINE HCL 10 MG PO TABS: 10.0000 mg | ORAL_TABLET | Freq: Every day | ORAL | 1 refills | Status: AC

## 2023-05-02 NOTE — Patient Instructions (Signed)

## 2023-05-02 NOTE — Progress Notes (Signed)
Subjective:  Patient ID: Regina Hayes, female    DOB: 1995/11/30  Age: 27 y.o. MRN: [id number]  CC: Medical Management of Chronic Issues   HPI Regina Hayes is a 27 y.o. year old female with a history of Type 2 DM (A1c 6.5 )   Interval History: Discussed the use of AI scribe software for clinical note transcription with the patient, who gave verbal consent to proceed.  She presents for a follow-up visit. She reports excellent adherence to her metformin regimen, taking two tablets twice daily. Her A1c has improved from 7.7 to 6.5, indicating good glycemic control.  She also expresses concern about a delayed eye check-up appointment, which was scheduled six months out. She has not yet scheduled this appointment.  On review of her chart it indicates [facility] had called her to schedule appointment but was unsuccessful and so they closed the referral.  In addition, she reports a recent onset of a scratchy throat and coughing for the past two to three days. She denies any associated mucus production or pain.  No sinus pressure.  No fever or arthralgia.       Past Medical History:  Diagnosis Date   Diabetes mellitus without complication (HCC)    Gestational diabetes    Medical history non-contributory    S/P appendectomy 12/25/2019   Type 2 diabetes mellitus (HCC) 10/20/2022    Past Surgical History:  Procedure Laterality Date   APPENDECTOMY      Family History  Problem Relation Age of Onset   Diabetes Mother     Social History   Socioeconomic History   Marital status: Married    Spouse name: Not on file   Number of children: Not on file   Years of education: Not on file   Highest education level: 12th grade  Occupational History   Not on file  Tobacco Use   Smoking status: Never   Smokeless tobacco: Never  Vaping Use   Vaping status: Never Used  Substance and Sexual Activity   Alcohol use: Not Currently   Drug use: Never   Sexual activity: Yes    Birth  control/protection: None  Other Topics Concern   Not on file  Social History Narrative   Not on file   Social Determinants of Health   Financial Resource Strain: Patient Declined (05/01/2023)   Overall Financial Resource Strain (CARDIA)    Difficulty of Paying Living Expenses: Patient declined  Food Insecurity: Food Insecurity Present (05/01/2023)   Hunger Vital Sign    Worried About Running Out of Food in the Last Year: Sometimes true    Ran Out of Food in the Last Year: Sometimes true  Transportation Needs: No Transportation Needs (05/01/2023)   PRAPARE - Administrator, Civil Service (Medical): No    Lack of Transportation (Non-Medical): No  Physical Activity: Insufficiently Active (05/01/2023)   Exercise Vital Sign    Days of Exercise per Week: 2 days    Minutes of Exercise per Session: 10 min  Stress: No Stress Concern Present (05/01/2023)   [employer] of Occupational Health - Occupational Stress Questionnaire    Feeling of Stress : Not at all  Social Connections: Moderately Isolated (05/01/2023)   Social Connection and Isolation Panel [NHANES]    Frequency of Communication with Friends and Family: Once a week    Frequency of Social Gatherings with Friends and Family: Once a week    Attends Religious Services: 1 to 4 times per year  Active Member of Clubs or Organizations: No    Attends Engineer, structural: Not on file    Marital Status: Married    No Known Allergies  Outpatient Medications Prior to Visit  Medication Sig Dispense Refill   metFORMIN (GLUCOPHAGE) 500 MG tablet Take 2 tablets (1,000 mg total) by mouth 2 (two) times daily with a meal. 120 tablet 3   Continuous Glucose Receiver (FREESTYLE LIBRE 2 READER) DEVI Use to check blood sugar continuously throughout the day. E11.9 (Patient not taking: Reported on 05/02/2023) 1 each 0   Continuous Glucose Sensor (FREESTYLE LIBRE 2 SENSOR) MISC Use to check blood sugar continuously throughout the  day. Change sensors once every 14 days. E11.9 (Patient not taking: Reported on 05/02/2023) 2 each 6   No facility-administered medications prior to visit.     ROS Review of Systems  Constitutional:  Negative for activity change and appetite change.  HENT:  Negative for sinus pressure and sore throat.   Respiratory:  Positive for cough. Negative for chest tightness, shortness of breath and wheezing.   Cardiovascular:  Negative for chest pain and palpitations.  Gastrointestinal:  Negative for abdominal distention, abdominal pain and constipation.  Genitourinary: Negative.   Musculoskeletal: Negative.   Psychiatric/Behavioral:  Negative for behavioral problems and dysphoric mood.     Objective:  BP 108/71   Pulse 82   Ht 5\' 3"  (1.6 m)   Wt 113 lb 6.4 oz (51.4 kg)   SpO2 99%   BMI 20.09 kg/m      05/02/2023   10:49 AM 01/23/2023    3:27 PM 12/06/2022    1:39 PM  BP/Weight  Systolic BP 108 106 107  Diastolic BP 71 70 74  Wt. (Lbs) 113.4 119.6 124  BMI 20.09 kg/m2 21.19 kg/m2 21.97 kg/m2      Physical Exam Constitutional:      Appearance: She is well-developed.  HENT:     Right Ear: Tympanic membrane normal.     Left Ear: Tympanic membrane normal.     Mouth/Throat:     Mouth: Mucous membranes are moist.  Cardiovascular:     Rate and Rhythm: Normal rate.     Heart sounds: Normal heart sounds. No murmur heard. Pulmonary:     Effort: Pulmonary effort is normal.     Breath sounds: Normal breath sounds. No wheezing or rales.  Chest:     Chest wall: No tenderness.  Abdominal:     General: Bowel sounds are normal. There is no distension.     Palpations: Abdomen is soft. There is no mass.     Tenderness: There is no abdominal tenderness.  Musculoskeletal:        General: Normal range of motion.     Right lower leg: No edema.     Left lower leg: No edema.  Neurological:     Mental Status: She is alert and oriented to person, place, and time.  Psychiatric:        Mood  and Affect: Mood normal.        Latest Ref Rng & Units 10/20/2022    8:11 AM 05/29/2022   11:29 AM 12/20/2021   11:32 AM  CMP  Glucose 70 - 99 mg/dL 71  53  098   BUN 6 - 20 mg/dL 5  10  11    Creatinine 0.44 - 1.00 mg/dL 1.19  1.47  8.29   Sodium 135 - 145 mmol/L 136  136  138   Potassium 3.5 -  5.1 mmol/L 3.5  3.4  4.3   Chloride 98 - 111 mmol/L 106  101  102   CO2 22 - 32 mmol/L 22  19  21    Calcium 8.9 - 10.3 mg/dL 8.4  9.0  9.3   Total Protein 6.5 - 8.1 g/dL 6.4  7.0  7.8   Total Bilirubin 0.3 - 1.2 mg/dL 0.5  0.3  0.8   Alkaline Phos 38 - 126 U/L 115  50  70   AST 15 - 41 U/L 22  14  19    ALT 0 - 44 U/L 14  7  13      Lipid Panel     Component Value Date/Time   CHOL 152 12/20/2021 1132   TRIG 62 12/20/2021 1132   HDL 45 12/20/2021 1132   LDLCALC 94 12/20/2021 1132    CBC    Component Value Date/Time   WBC 10.1 10/20/2022 0811   RBC 4.34 10/20/2022 0811   HGB 13.1 10/20/2022 0811   HGB 12.4 08/20/2022 0923   HGB 12.5 04/26/2022 0000   HGB 12.5 04/26/2022 0000   HCT 37.4 10/20/2022 0811   HCT 36.2 08/20/2022 0923   HCT 37 04/26/2022 0000   PLT 135 (L) 10/20/2022 0811   PLT 144 (L) 08/20/2022 0923   PLT 173 04/26/2022 0000   MCV 86.2 10/20/2022 0811   MCV 86 08/20/2022 0923   MCH 30.2 10/20/2022 0811   MCHC 35.0 10/20/2022 0811   RDW 12.8 10/20/2022 0811   RDW 13.0 08/20/2022 0923   LYMPHSABS 2.2 12/20/2021 1132   EOSABS 0.4 12/20/2021 1132   BASOSABS 0.1 12/20/2021 1132    Lab Results  Component Value Date   HGBA1C 6.5 05/02/2023    Assessment & Plan:      Type 2 Diabetes Mellitus Improved glycemic control with A1c reduction from 7.7 to 6.5. Currently on Metformin 2 tablets twice daily. -Continue Metformin at current dose. -Delay in scheduling annual diabetic eye examination. -Place another referral for eye examination.  Cough/Throat Discomfort Reports of scratchy throat and cough for 2-3 days. No pain on palpation. Possible postnasal  drip. -Prescribe Zyrtec  General Health Maintenance -Administer influenza vaccine today. -Plan for cholesterol check at next visit, patient advised to fast.          Meds ordered this encounter  Medications   metFORMIN (GLUCOPHAGE) 500 MG tablet    Sig: Take 2 tablets (1,000 mg total) by mouth 2 (two) times daily with a meal.    Dispense:  360 tablet    Refill:  1    Discontinue insulin   cetirizine (ZYRTEC) 10 MG tablet    Sig: Take 1 tablet (10 mg total) by mouth daily.    Dispense:  30 tablet    Refill:  1    Follow-up: Return in about 6 months (around 10/30/2023).       Hoy Register, MD, FAAFP. [facility] and Wellness [place], [place] [phone]   05/02/2023, 11:50 AM

## 2023-05-03 LAB — CMP14+EGFR
ALT: 8 [IU]/L (ref 0–32)
AST: 16 [IU]/L (ref 0–40)
Albumin: 4.8 g/dL (ref 4.0–5.0)
Alkaline Phosphatase: 59 [IU]/L (ref 44–121)
BUN/Creatinine Ratio: 17 (ref 9–23)
BUN: 9 mg/dL (ref 6–20)
Bilirubin Total: 0.4 mg/dL (ref 0.0–1.2)
CO2: 20 mmol/L (ref 20–29)
Calcium: 10.1 mg/dL (ref 8.7–10.2)
Chloride: 102 mmol/L (ref 96–106)
Creatinine, Ser: 0.54 mg/dL — ABNORMAL LOW (ref 0.57–1.00)
Globulin, Total: 3 g/dL (ref 1.5–4.5)
Glucose: 83 mg/dL (ref 70–99)
Potassium: 4.5 mmol/L (ref 3.5–5.2)
Sodium: 139 mmol/L (ref 134–144)
Total Protein: 7.8 g/dL (ref 6.0–8.5)
eGFR: 128 mL/min/{1.73_m2} (ref >59)

## 2023-05-26 ENCOUNTER — Other Ambulatory Visit: Payer: Self-pay | Admitting: Family Medicine

## 2023-07-25 ENCOUNTER — Other Ambulatory Visit: Payer: Self-pay | Admitting: Advanced Practice Midwife

## 2023-09-20 LAB — HM DIABETES EYE EXAM

## 2023-10-31 ENCOUNTER — Ambulatory Visit: Payer: Medicaid Other | Attending: Family Medicine | Admitting: Family Medicine

## 2023-10-31 ENCOUNTER — Encounter: Payer: Self-pay | Admitting: Family Medicine

## 2023-10-31 VITALS — BP 111/73 | HR 78 | Ht 63.0 in | Wt 109.2 lb

## 2023-10-31 DIAGNOSIS — Z7984 Long term (current) use of oral hypoglycemic drugs: Secondary | ICD-10-CM | POA: Diagnosis not present

## 2023-10-31 DIAGNOSIS — E119 Type 2 diabetes mellitus without complications: Secondary | ICD-10-CM | POA: Diagnosis not present

## 2023-10-31 LAB — POCT GLYCOSYLATED HEMOGLOBIN (HGB A1C): HbA1c, POC (controlled diabetic range): 6.6 % (ref 0.0–7.0)

## 2023-10-31 MED ORDER — METFORMIN HCL 500 MG PO TABS: 1000.0000 mg | ORAL_TABLET | Freq: Two times a day (BID) | ORAL | 1 refills | Status: DC

## 2023-10-31 NOTE — Patient Instructions (Signed)
 VISIT SUMMARY:  Today, we reviewed your diabetes management and noted that your A1c has improved to 6.6, indicating better control of your diabetes. You are continuing with your current medication regimen and have had a recent eye exam with good results. We also discussed your upcoming travel plans and your need for a medication refill.  YOUR PLAN:  -TYPE 2 DIABETES MELLITUS: Type 2 Diabetes Mellitus is a condition where your body does not use insulin  properly, leading to high blood sugar levels. Your diabetes is well-controlled with an A1c of 6.6. Continue taking metformin  500 mg, 2 tablets twice daily. You received a pneumonia vaccine today to help protect against infections. We have ordered blood tests to monitor your condition and scheduled a follow-up appointment in 6 months. Please call us  2 weeks before your July travel to arrange for a medication refill.  INSTRUCTIONS:  Please call us  2 weeks before your July travel to arrange for a medication refill. Your next follow-up appointment is scheduled in 6 months.

## 2023-10-31 NOTE — Progress Notes (Signed)
 Subjective:  Patient ID: Regina Hayes, female    DOB: [birth year]  Age: 28 y.o. MRN: [id number]  CC: Medical Management of Chronic Issues     Discussed the use of AI scribe software for clinical note transcription with the patient, who gave verbal consent to proceed.  History of Present Illness Regina Hayes is a 28 year old female with a history of Type 2 DM who presents for a follow-up visit.  Her diabetes management is progressing well, with a recent A1c of 6.6. She continues to take metformin , two tablets in the morning and two in the evening, with no changes in her medication regimen. She confirms having had an eye exam within the last year, with good eye health reported. She plans to travel back to her country in July for an extended period and requests a medication refill to cover her time away. Denies additional concerns.   Past Medical History:  Diagnosis Date   Diabetes mellitus without complication (HCC)    Gestational diabetes    Medical history non-contributory    S/P appendectomy 12/25/2019   Type 2 diabetes mellitus (HCC) 10/20/2022    Past Surgical History:  Procedure Laterality Date   APPENDECTOMY      Family History  Problem Relation Age of Onset   Diabetes Mother     Social History   Socioeconomic History   Marital status: Married    Spouse name: Not on file   Number of children: Not on file   Years of education: Not on file   Highest education level: 12th grade  Occupational History   Not on file  Tobacco Use   Smoking status: Never   Smokeless tobacco: Never  Vaping Use   Vaping status: Never Used  Substance and Sexual Activity   Alcohol use: Not Currently   Drug use: Never   Sexual activity: Yes    Birth control/protection: None  Other Topics Concern   Not on file  Social History Narrative   Not on file   Social Drivers of Health   Financial Resource Strain: Medium Risk (10/30/2023)   Overall Financial Resource Strain (CARDIA)     Difficulty of Paying Living Expenses: Somewhat hard  Food Insecurity: Food Insecurity Present (10/30/2023)   Hunger Vital Sign    Worried About Running Out of Food in the Last Year: Sometimes true    Ran Out of Food in the Last Year: Sometimes true  Transportation Needs: No Transportation Needs (10/30/2023)   PRAPARE - Administrator, Civil Service (Medical): No    Lack of Transportation (Non-Medical): No  Physical Activity: Unknown (10/30/2023)   Exercise Vital Sign    Days of Exercise per Week: 3 days    Minutes of Exercise per Session: Patient declined  Stress: No Stress Concern Present (10/30/2023)   [employer] of Occupational Health - Occupational Stress Questionnaire    Feeling of Stress : Only a little  Social Connections: Moderately Integrated (10/30/2023)   Social Connection and Isolation Panel [NHANES]    Frequency of Communication with Friends and Family: More than three times a week    Frequency of Social Gatherings with Friends and Family: Twice a week    Attends Religious Services: 1 to 4 times per year    Active Member of [employer] or Organizations: No    Attends Engineer, structural: Not on file    Marital Status: Married    No Known Allergies  Outpatient Medications Prior to Visit  Medication  Sig Dispense Refill   cetirizine  (ZYRTEC ) 10 MG tablet Take 1 tablet (10 mg total) by mouth daily. 30 tablet 1   Continuous Glucose Receiver (FREESTYLE LIBRE 2 READER) DEVI Use to check blood sugar continuously throughout the day. E11.9 1 each 0   Continuous Glucose Sensor (FREESTYLE LIBRE 2 SENSOR) MISC Use to check blood sugar continuously throughout the day. Change sensors once every 14 days. E11.9 2 each 6   metFORMIN  (GLUCOPHAGE ) 500 MG tablet Take 2 tablets (1,000 mg total) by mouth 2 (two) times daily with a meal. 360 tablet 1   No facility-administered medications prior to visit.     ROS Review of Systems  Constitutional:  Negative for activity  change and appetite change.  HENT:  Negative for sinus pressure and sore throat.   Respiratory:  Negative for chest tightness, shortness of breath and wheezing.   Cardiovascular:  Negative for chest pain and palpitations.  Gastrointestinal:  Negative for abdominal distention, abdominal pain and constipation.  Genitourinary: Negative.   Musculoskeletal: Negative.   Psychiatric/Behavioral:  Negative for behavioral problems and dysphoric mood.     Objective:  BP 111/73   Pulse 78   Ht 5\' 3"  (1.6 m)   Wt 109 lb 3.2 oz (49.5 kg)   SpO2 100%   BMI 19.34 kg/m      10/31/2023   11:46 AM 05/02/2023   10:49 AM 01/23/2023    3:27 PM  BP/Weight  Systolic BP 111 108 106  Diastolic BP 73 71 70  Wt. (Lbs) 109.2 113.4 119.6  BMI 19.34 kg/m2 20.09 kg/m2 21.19 kg/m2      Physical Exam Constitutional:      Appearance: She is well-developed.  Cardiovascular:     Rate and Rhythm: Normal rate.     Heart sounds: Normal heart sounds. No murmur heard. Pulmonary:     Effort: Pulmonary effort is normal.     Breath sounds: Normal breath sounds. No wheezing or rales.  Chest:     Chest wall: No tenderness.  Abdominal:     General: Bowel sounds are normal. There is no distension.     Palpations: Abdomen is soft. There is no mass.     Tenderness: There is no abdominal tenderness.  Musculoskeletal:        General: Normal range of motion.     Right lower leg: No edema.     Left lower leg: No edema.  Neurological:     Mental Status: She is alert and oriented to person, place, and time.  Psychiatric:        Mood and Affect: Mood normal.        Latest Ref Rng & Units 05/02/2023   11:28 AM 10/20/2022    8:11 AM 05/29/2022   11:29 AM  CMP  Glucose 70 - 99 mg/dL 83  71  53   BUN 6 - 20 mg/dL 9  5  10    Creatinine 0.57 - 1.00 mg/dL 3.24  4.01  0.27   Sodium 134 - 144 mmol/L 139  136  136   Potassium 3.5 - 5.2 mmol/L 4.5  3.5  3.4   Chloride 96 - 106 mmol/L 102  106  101   CO2 20 - 29 mmol/L 20   22  19    Calcium 8.7 - 10.2 mg/dL 25.3  8.4  9.0   Total Protein 6.0 - 8.5 g/dL 7.8  6.4  7.0   Total Bilirubin 0.0 - 1.2 mg/dL 0.4  0.5  0.3  Alkaline Phos 44 - 121 IU/L 59  115  50   AST 0 - 40 IU/L 16  22  14    ALT 0 - 32 IU/L 8  14  7      Lipid Panel     Component Value Date/Time   CHOL 152 12/20/2021 1132   TRIG 62 12/20/2021 1132   HDL 45 12/20/2021 1132   LDLCALC 94 12/20/2021 1132    CBC    Component Value Date/Time   WBC 10.1 10/20/2022 0811   RBC 4.34 10/20/2022 0811   HGB 13.1 10/20/2022 0811   HGB 12.4 08/20/2022 0923   HGB 12.5 04/26/2022 0000   HGB 12.5 04/26/2022 0000   HCT 37.4 10/20/2022 0811   HCT 36.2 08/20/2022 0923   HCT 37 04/26/2022 0000   PLT 135 (L) 10/20/2022 0811   PLT 144 (L) 08/20/2022 0923   PLT 173 04/26/2022 0000   MCV 86.2 10/20/2022 0811   MCV 86 08/20/2022 0923   MCH 30.2 10/20/2022 0811   MCHC 35.0 10/20/2022 0811   RDW 12.8 10/20/2022 0811   RDW 13.0 08/20/2022 0923   LYMPHSABS 2.2 12/20/2021 1132   EOSABS 0.4 12/20/2021 1132   BASOSABS 0.1 12/20/2021 1132    Lab Results  Component Value Date   HGBA1C 6.6 10/31/2023       Assessment & Plan Type 2 Diabetes Mellitus Diabetes well-controlled with A1c of 6.6 - Continue metformin  500 mg, 2 tablets twice daily. - Administer pneumonia vaccine today. - Order blood tests. - Schedule follow-up in 6 months. -Counseled on Diabetic diet, my plate method, 102 minutes of moderate intensity exercise/week Blood sugar logs with fasting goals of 80-120 mg/dl, random of less than 725 and in the event of sugars less than 60 mg/dl or greater than 366 mg/dl encouraged to notify the clinic. Advised on the need for annual eye exams, annual foot exams, Pneumonia vaccine. - Advise to call 2 weeks before July travel for medication refill.         No orders of the defined types were placed in this encounter.   Follow-up: No follow-ups on file.       Joaquin Mulberry, MD,  FAAFP. [facility] [place], [place] [phone]   10/31/2023, 12:15 PM

## 2023-11-01 ENCOUNTER — Encounter: Payer: Self-pay | Admitting: Family Medicine

## 2023-11-01 LAB — CMP14+EGFR
ALT: 8 IU/L (ref 0–32)
AST: 14 IU/L (ref 0–40)
Albumin: 4.7 g/dL (ref 4.0–5.0)
Alkaline Phosphatase: 63 IU/L (ref 44–121)
BUN/Creatinine Ratio: 12 (ref 9–23)
BUN: 6 mg/dL (ref 6–20)
Bilirubin Total: 0.4 mg/dL (ref 0.0–1.2)
CO2: 19 mmol/L — ABNORMAL LOW (ref 20–29)
Calcium: 9.8 mg/dL (ref 8.7–10.2)
Chloride: 104 mmol/L (ref 96–106)
Creatinine, Ser: 0.5 mg/dL — ABNORMAL LOW (ref 0.57–1.00)
Globulin, Total: 3.2 g/dL (ref 1.5–4.5)
Glucose: 90 mg/dL (ref 70–99)
Potassium: 4.7 mmol/L (ref 3.5–5.2)
Sodium: 140 mmol/L (ref 134–144)
Total Protein: 7.9 g/dL (ref 6.0–8.5)
eGFR: 130 mL/min/{1.73_m2} (ref >59)

## 2023-12-02 ENCOUNTER — Telehealth: Payer: Self-pay | Admitting: Family Medicine

## 2023-12-02 NOTE — Telephone Encounter (Signed)
 PT requesting on metformin  refill..... and a letter stating she needs to take the meds while traveling

## 2023-12-03 NOTE — Telephone Encounter (Signed)
Patient requesting letter.

## 2023-12-03 NOTE — Telephone Encounter (Signed)
 Letter has been written.

## 2023-12-03 NOTE — Telephone Encounter (Signed)
Patient was called and informed of letter being ready for pick up. 

## 2024-05-07 ENCOUNTER — Encounter: Payer: Self-pay | Admitting: Family Medicine

## 2024-05-07 ENCOUNTER — Ambulatory Visit: Attending: Family Medicine | Admitting: Family Medicine

## 2024-05-07 VITALS — BP 105/69 | HR 78 | Temp 98.0°F | Ht 63.0 in | Wt 112.2 lb

## 2024-05-07 DIAGNOSIS — E119 Type 2 diabetes mellitus without complications: Secondary | ICD-10-CM

## 2024-05-07 DIAGNOSIS — Z23 Encounter for immunization: Secondary | ICD-10-CM | POA: Diagnosis not present

## 2024-05-07 DIAGNOSIS — Z7984 Long term (current) use of oral hypoglycemic drugs: Secondary | ICD-10-CM

## 2024-05-07 LAB — POCT GLYCOSYLATED HEMOGLOBIN (HGB A1C): HbA1c, POC (controlled diabetic range): 7.2 % — AB (ref 0.0–7.0)

## 2024-05-07 MED ORDER — METFORMIN HCL 500 MG PO TABS: 1000.0000 mg | ORAL_TABLET | Freq: Two times a day (BID) | ORAL | 1 refills | Status: AC

## 2024-05-07 NOTE — Progress Notes (Signed)
 Subjective:  Patient ID: Regina Hayes, female    DOB: 02-Apr-1996  Age: 28 y.o. MRN: [id number]  CC: Medical Management of Chronic Issues     Discussed the use of AI scribe software for clinical note transcription with the patient, who gave verbal consent to proceed.  History of Present Illness Regina Hayes is a 28 year old female with type II diabetes Test who presents for a follow-up visit.  She is taking metformin  1000 mg twice daily. Her recent A1c is 7.2, an increase from her previous A1c of 6.6.  She denies presence of additional concerns today. She is due for a pneumonia vaccine and flu vaccine but she declines pneumonia vaccine. Last set of labs revealed a normal comprehensive metabolic panel but she is overdue for lipid panel.    Past Medical History:  Diagnosis Date   Diabetes mellitus without complication (HCC)    Gestational diabetes    Medical history non-contributory    S/P appendectomy 12/25/2019   Type 2 diabetes mellitus (HCC) 10/20/2022    Past Surgical History:  Procedure Laterality Date   APPENDECTOMY      Family History  Problem Relation Age of Onset   Diabetes Mother     Social History   Socioeconomic History   Marital status: Married    Spouse name: Not on file   Number of children: Not on file   Years of education: Not on file   Highest education level: 12th grade  Occupational History   Not on file  Tobacco Use   Smoking status: Never   Smokeless tobacco: Never  Vaping Use   Vaping status: Never Used  Substance and Sexual Activity   Alcohol use: Not Currently   Drug use: Never   Sexual activity: Yes    Birth control/protection: None  Other Topics Concern   Not on file  Social History Narrative   Not on file   Social Drivers of Health   Financial Resource Strain: Low Risk  (05/06/2024)   Overall Financial Resource Strain (CARDIA)    Difficulty of Paying Living Expenses: Not very hard  Food Insecurity: Food Insecurity  Present (05/06/2024)   Hunger Vital Sign    Worried About Running Out of Food in the Last Year: Sometimes true    Ran Out of Food in the Last Year: Sometimes true  Transportation Needs: No Transportation Needs (10/30/2023)   PRAPARE - Administrator, Civil Service (Medical): No    Lack of Transportation (Non-Medical): No  Physical Activity: Insufficiently Active (05/06/2024)   Exercise Vital Sign    Days of Exercise per Week: 2 days    Minutes of Exercise per Session: 10 min  Stress: No Stress Concern Present (05/06/2024)   [employer] of Occupational Health - Occupational Stress Questionnaire    Feeling of Stress: Not at all  Social Connections: Moderately Isolated (05/06/2024)   Social Connection and Isolation Panel    Frequency of Communication with Friends and Family: More than three times a week    Frequency of Social Gatherings with Friends and Family: Twice a week    Attends Religious Services: Patient declined    Database Administrator or Organizations: No    Attends Engineer, Structural: Not on file    Marital Status: Married    No Known Allergies  Outpatient Medications Prior to Visit  Medication Sig Dispense Refill   cetirizine  (ZYRTEC ) 10 MG tablet Take 1 tablet (10 mg total) by mouth daily. 30  tablet 1   Continuous Glucose Receiver (FREESTYLE LIBRE 2 READER) DEVI Use to check blood sugar continuously throughout the day. E11.9 1 each 0   Continuous Glucose Sensor (FREESTYLE LIBRE 2 SENSOR) MISC Use to check blood sugar continuously throughout the day. Change sensors once every 14 days. E11.9 2 each 6   metFORMIN  (GLUCOPHAGE ) 500 MG tablet Take 2 tablets (1,000 mg total) by mouth 2 (two) times daily with a meal. 360 tablet 1   No facility-administered medications prior to visit.     ROS Review of Systems  Constitutional:  Negative for activity change and appetite change.  HENT:  Negative for sinus pressure and sore throat.   Respiratory:   Negative for chest tightness, shortness of breath and wheezing.   Cardiovascular:  Negative for chest pain and palpitations.  Gastrointestinal:  Negative for abdominal distention, abdominal pain and constipation.  Genitourinary: Negative.   Musculoskeletal: Negative.   Psychiatric/Behavioral:  Negative for behavioral problems and dysphoric mood.     Objective:  BP 105/69   Pulse 78   Temp 98 F (36.7 C) (Oral)   Ht 5' 3 (1.6 m)   Wt 112 lb 3.2 oz (50.9 kg)   SpO2 100%   BMI 19.88 kg/m      05/07/2024   11:12 AM 10/31/2023   11:46 AM 05/02/2023   10:49 AM  BP/Weight  Systolic BP 105 111 108  Diastolic BP 69 73 71  Wt. (Lbs) 112.2 109.2 113.4  BMI 19.88 kg/m2 19.34 kg/m2 20.09 kg/m2      Physical Exam Constitutional:      Appearance: She is well-developed.  Cardiovascular:     Rate and Rhythm: Normal rate.     Heart sounds: Normal heart sounds. No murmur heard. Pulmonary:     Effort: Pulmonary effort is normal.     Breath sounds: Normal breath sounds. No wheezing or rales.  Chest:     Chest wall: No tenderness.  Abdominal:     General: Bowel sounds are normal. There is no distension.     Palpations: Abdomen is soft. There is no mass.     Tenderness: There is no abdominal tenderness.  Musculoskeletal:        General: Normal range of motion.     Right lower leg: No edema.     Left lower leg: No edema.  Neurological:     Mental Status: She is alert and oriented to person, place, and time.  Psychiatric:        Mood and Affect: Mood normal.    Diabetic Foot Exam - Simple   Simple Foot Form Diabetic Foot exam was performed with the following findings: Yes 05/07/2024 11:21 AM  Visual Inspection No deformities, no ulcerations, no other skin breakdown bilaterally: Yes Sensation Testing Intact to touch and monofilament testing bilaterally: Yes Pulse Check Posterior Tibialis and Dorsalis pulse intact bilaterally: Yes Comments        Latest Ref Rng & Units  10/31/2023   12:25 PM 05/02/2023   11:28 AM 10/20/2022    8:11 AM  CMP  Glucose 70 - 99 mg/dL 90  83  71   BUN 6 - 20 mg/dL 6  9  5    Creatinine 0.57 - 1.00 mg/dL 9.49  9.45  9.48   Sodium 134 - 144 mmol/L 140  139  136   Potassium 3.5 - 5.2 mmol/L 4.7  4.5  3.5   Chloride 96 - 106 mmol/L 104  102  106   CO2  20 - 29 mmol/L 19  20  22    Calcium 8.7 - 10.2 mg/dL 9.8  89.8  8.4   Total Protein 6.0 - 8.5 g/dL 7.9  7.8  6.4   Total Bilirubin 0.0 - 1.2 mg/dL 0.4  0.4  0.5   Alkaline Phos 44 - 121 IU/L 63  59  115   AST 0 - 40 IU/L 14  16  22    ALT 0 - 32 IU/L 8  8  14      Lipid Panel     Component Value Date/Time   CHOL 152 12/20/2021 1132   TRIG 62 12/20/2021 1132   HDL 45 12/20/2021 1132   LDLCALC 94 12/20/2021 1132    CBC    Component Value Date/Time   WBC 10.1 10/20/2022 0811   RBC 4.34 10/20/2022 0811   HGB 13.1 10/20/2022 0811   HGB 12.4 08/20/2022 0923   HGB 12.5 04/26/2022 0000   HGB 12.5 04/26/2022 0000   HCT 37.4 10/20/2022 0811   HCT 36.2 08/20/2022 0923   HCT 37 04/26/2022 0000   PLT 135 (L) 10/20/2022 0811   PLT 144 (L) 08/20/2022 0923   PLT 173 04/26/2022 0000   MCV 86.2 10/20/2022 0811   MCV 86 08/20/2022 0923   MCH 30.2 10/20/2022 0811   MCHC 35.0 10/20/2022 0811   RDW 12.8 10/20/2022 0811   RDW 13.0 08/20/2022 0923   LYMPHSABS 2.2 12/20/2021 1132   EOSABS 0.4 12/20/2021 1132   BASOSABS 0.1 12/20/2021 1132    Lab Results  Component Value Date   HGBA1C 7.2 (A) 05/07/2024   Lab Results  Component Value Date   HGBA1C 7.2 (A) 05/07/2024   HGBA1C 6.6 10/31/2023   HGBA1C 6.5 05/02/2023        Assessment & Plan Type 2 diabetes mellitus without complications A1c increased to 7.2 up from 6.6 previously - Continue metformin  1000 mg oral twice daily. - Advised dietary modifications to reduce starch intake. - Ordered fasting blood test for cholesterol, comprehensive metabolic panel next Thursday.    Healthcare maintenance Encounter for  influenza vaccine administration-flu shot administered  Meds ordered this encounter  Medications   metFORMIN  (GLUCOPHAGE ) 500 MG tablet    Sig: Take 2 tablets (1,000 mg total) by mouth 2 (two) times daily with a meal.    Dispense:  360 tablet    Refill:  1    Follow-up: Return in about 6 months (around 11/04/2024) for Chronic medical conditions.       Corrina Sabin, MD, FAAFP. [facility] [place], [place] [phone]   05/07/2024, 11:35 AM

## 2024-05-07 NOTE — Patient Instructions (Signed)

## 2024-05-14 ENCOUNTER — Ambulatory Visit

## 2024-05-14 ENCOUNTER — Ambulatory Visit: Attending: Family Medicine

## 2024-05-14 DIAGNOSIS — E119 Type 2 diabetes mellitus without complications: Secondary | ICD-10-CM

## 2024-05-15 LAB — CMP14+EGFR
ALT: 6 IU/L (ref 0–32)
AST: 15 IU/L (ref 0–40)
Albumin: 4.5 g/dL (ref 4.0–5.0)
Alkaline Phosphatase: 49 IU/L (ref 41–116)
BUN/Creatinine Ratio: 18 (ref 9–23)
BUN: 10 mg/dL (ref 6–20)
Bilirubin Total: 0.7 mg/dL (ref 0.0–1.2)
CO2: 21 mmol/L (ref 20–29)
Calcium: 9.5 mg/dL (ref 8.7–10.2)
Chloride: 100 mmol/L (ref 96–106)
Creatinine, Ser: 0.56 mg/dL — ABNORMAL LOW (ref 0.57–1.00)
Globulin, Total: 3.1 g/dL (ref 1.5–4.5)
Glucose: 97 mg/dL (ref 70–99)
Potassium: 4.5 mmol/L (ref 3.5–5.2)
Sodium: 136 mmol/L (ref 134–144)
Total Protein: 7.6 g/dL (ref 6.0–8.5)
eGFR: 126 mL/min/1.73 (ref >59)

## 2024-05-15 LAB — LP+NON-HDL CHOLESTEROL
Cholesterol, Total: 159 mg/dL (ref 100–199)
HDL: 57 mg/dL (ref >39)
LDL Chol Calc (NIH): 87 mg/dL (ref 0–99)
Total Non-HDL-Chol (LDL+VLDL): 102 mg/dL (ref 0–129)
Triglycerides: 80 mg/dL (ref 0–149)
VLDL Cholesterol Cal: 15 mg/dL (ref 5–40)

## 2024-05-15 LAB — MICROALBUMIN / CREATININE URINE RATIO
Creatinine, Urine: 138.7 mg/dL
Microalb/Creat Ratio: 11 mg/g{creat} (ref 0–29)
Microalbumin, Urine: 15.4 ug/mL

## 2024-05-18 ENCOUNTER — Ambulatory Visit: Payer: Self-pay | Admitting: Family Medicine

## 2024-11-05 ENCOUNTER — Ambulatory Visit: Payer: Self-pay | Admitting: Family Medicine
